# Patient Record
Sex: Female | Born: 1960
Health system: Midwestern US, Community
[De-identification: ages and names within clinical notes are randomized; demographics above are authoritative.]

## PROBLEM LIST (undated history)

## (undated) DIAGNOSIS — G8929 Other chronic pain: Secondary | ICD-10-CM

## (undated) DIAGNOSIS — Z451 Encounter for adjustment and management of infusion pump: Principal | ICD-10-CM

## (undated) DIAGNOSIS — Z9889 Other specified postprocedural states: Principal | ICD-10-CM

## (undated) DIAGNOSIS — G9609 Other spinal cerebrospinal fluid leak: Principal | ICD-10-CM

## (undated) DIAGNOSIS — M545 Low back pain, unspecified: Secondary | ICD-10-CM

## (undated) DIAGNOSIS — K219 Gastro-esophageal reflux disease without esophagitis: Secondary | ICD-10-CM

## (undated) HISTORY — DX: Low back pain: M54.5

## (undated) HISTORY — DX: Gastro-esophageal reflux disease without esophagitis: K21.9

## (undated) HISTORY — DX: Low back pain, unspecified: M54.50

---
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
## 1998-02-03 HISTORY — PX: OTHER SURGICAL HISTORY: SHX169

## 2000-02-04 HISTORY — PX: NASAL SINUS SURGERY: SHX719

## 2005-02-03 HISTORY — PX: ABDOMINAL HYSTERECTOMY: SHX81

## 2007-02-04 HISTORY — PX: CHOLECYSTECTOMY: SHX55

## 2008-02-04 HISTORY — PX: CARPAL TUNNEL RELEASE: SHX101

## 2008-02-29 ENCOUNTER — Emergency Department (HOSPITAL_COMMUNITY): Admission: EM | Admit: 2008-02-29 | Discharge: 2008-02-29 | Payer: Self-pay | Admitting: Emergency Medicine

## 2008-04-28 ENCOUNTER — Emergency Department (HOSPITAL_COMMUNITY): Admission: EM | Admit: 2008-04-28 | Discharge: 2008-04-29 | Payer: Self-pay | Admitting: Emergency Medicine

## 2008-08-14 ENCOUNTER — Emergency Department (HOSPITAL_COMMUNITY): Admission: EM | Admit: 2008-08-14 | Discharge: 2008-08-14 | Payer: Self-pay | Admitting: Emergency Medicine

## 2008-09-23 ENCOUNTER — Emergency Department (HOSPITAL_COMMUNITY): Admission: EM | Admit: 2008-09-23 | Discharge: 2008-09-24 | Payer: Self-pay | Admitting: Emergency Medicine

## 2008-09-24 ENCOUNTER — Ambulatory Visit (HOSPITAL_COMMUNITY): Admission: RE | Admit: 2008-09-24 | Discharge: 2008-09-24 | Payer: Self-pay | Admitting: Emergency Medicine

## 2008-09-24 ENCOUNTER — Ambulatory Visit: Payer: Self-pay | Admitting: Vascular Surgery

## 2008-09-24 ENCOUNTER — Encounter (INDEPENDENT_AMBULATORY_CARE_PROVIDER_SITE_OTHER): Payer: Self-pay | Admitting: Emergency Medicine

## 2008-11-20 ENCOUNTER — Encounter: Admission: RE | Admit: 2008-11-20 | Discharge: 2008-11-20 | Payer: Self-pay | Admitting: Orthopedic Surgery

## 2009-02-03 HISTORY — PX: OTHER SURGICAL HISTORY: SHX169

## 2009-02-03 HISTORY — PX: LUMBAR FUSION: SHX111

## 2009-03-22 ENCOUNTER — Inpatient Hospital Stay (HOSPITAL_COMMUNITY): Admission: RE | Admit: 2009-03-22 | Discharge: 2009-03-25 | Payer: Self-pay | Admitting: *Deleted

## 2009-04-08 ENCOUNTER — Inpatient Hospital Stay (HOSPITAL_COMMUNITY): Admission: RE | Admit: 2009-04-08 | Discharge: 2009-04-13 | Payer: Self-pay | Admitting: *Deleted

## 2009-04-10 ENCOUNTER — Encounter (INDEPENDENT_AMBULATORY_CARE_PROVIDER_SITE_OTHER): Payer: Self-pay | Admitting: Orthopedic Surgery

## 2009-04-10 ENCOUNTER — Ambulatory Visit: Payer: Self-pay | Admitting: Vascular Surgery

## 2009-04-18 ENCOUNTER — Inpatient Hospital Stay (HOSPITAL_COMMUNITY): Admission: AD | Admit: 2009-04-18 | Discharge: 2009-04-27 | Payer: Self-pay | Admitting: *Deleted

## 2009-04-23 ENCOUNTER — Ambulatory Visit: Payer: Self-pay | Admitting: Physical Medicine & Rehabilitation

## 2009-04-23 ENCOUNTER — Encounter (INDEPENDENT_AMBULATORY_CARE_PROVIDER_SITE_OTHER): Payer: Self-pay | Admitting: Orthopedic Surgery

## 2009-08-29 ENCOUNTER — Encounter
Admission: RE | Admit: 2009-08-29 | Discharge: 2009-11-27 | Payer: Self-pay | Admitting: Physical Medicine & Rehabilitation

## 2009-09-20 ENCOUNTER — Ambulatory Visit: Payer: Self-pay | Admitting: Physical Medicine & Rehabilitation

## 2009-11-01 ENCOUNTER — Ambulatory Visit: Payer: Self-pay | Admitting: Physical Medicine & Rehabilitation

## 2009-11-30 ENCOUNTER — Encounter
Admission: RE | Admit: 2009-11-30 | Discharge: 2010-02-28 | Payer: Self-pay | Source: Home / Self Care | Attending: Physical Medicine & Rehabilitation | Admitting: Physical Medicine & Rehabilitation

## 2009-12-03 ENCOUNTER — Ambulatory Visit: Payer: Self-pay | Admitting: Physical Medicine & Rehabilitation

## 2009-12-31 ENCOUNTER — Ambulatory Visit: Payer: Self-pay | Admitting: Physical Medicine & Rehabilitation

## 2010-02-24 ENCOUNTER — Encounter: Payer: Self-pay | Admitting: Internal Medicine

## 2010-03-26 NOTE — Procedures (Signed)
[name], [name]              ACCOUNT NO.:  [id number]  MEDICAL RECORD NO.:  [id number]           PATIENT TYPE:  LOCATION:                                 FACILITY:  PHYSICIAN:  Erick Colace, M.D.DATE OF BIRTH:  Feb 17, 1960  DATE OF PROCEDURE:  11/01/2009 DATE OF DISCHARGE:                              OPERATIVE REPORT  REASON FOR VISIT:  This is a left S1 transforaminal lumbar epidural steroid injection.  This is a late dictation.  INDICATION FOR THE PROCEDURE:  Pain from the back in to the left lower extremity, consistent with S1 radiculitis.  Pain is only partially responsive to medication management, interferes with activity, and result in Oswestry disability index score of 36%.  Informed consent was obtained after describing risks and benefits of the procedure with the patient.  These include bleeding, bruising, and infection.  She elects to proceed and has given written consent.  The patient was placed prone on fluoroscopy table.  Betadine prep, sterile drape.  A 25-gauge 1-1/2-inch needle was used to anesthetize the skin and subcu tissue, 1% lidocaine x1 mL.  Then, 22-gauge 3-1/2-inch spinal needle was inserted under fluoroscopic guidance, starting at the left S1 foramen.  AP and lateral images utilized.  Omnipaque 180 x2 mL demonstrated good epidural spread followed by injection of  1 mL of 10 mg/mL of dexamethasone and 2 mL of 1% MPF lidocaine.  The patient tolerated the procedure well.  Postprocedure instructions given.     Erick Colace, M.D. Electronically Signed    AEK/MEDQ  D:  03/25/2010 10:58:55  T:  03/25/2010 20:30:58  Job:  [id number]

## 2010-04-24 LAB — CBC
HCT: 36.9 % (ref 36.0–46.0)
Platelets: 288 10*3/uL (ref 150–400)
RDW: 12.4 % (ref 11.5–15.5)
WBC: 6.3 10*3/uL (ref 4.0–10.5)

## 2010-04-24 LAB — TYPE AND SCREEN
ABO/RH(D): O POS
Antibody Screen: NEGATIVE

## 2010-04-24 LAB — COMPREHENSIVE METABOLIC PANEL
Calcium: 9.1 mg/dL (ref 8.4–10.5)
Chloride: 100 mEq/L (ref 96–112)
Creatinine, Ser: 0.6 mg/dL (ref 0.4–1.2)
Potassium: 3.9 mEq/L (ref 3.5–5.1)
Sodium: 135 mEq/L (ref 135–145)
Total Protein: 7.5 g/dL (ref 6.0–8.3)

## 2010-04-24 LAB — URINALYSIS, ROUTINE W REFLEX MICROSCOPIC
Bilirubin Urine: NEGATIVE
Glucose, UA: NEGATIVE mg/dL
Ketones, ur: NEGATIVE mg/dL
Nitrite: NEGATIVE
pH: 7 (ref 5.0–8.0)

## 2010-04-24 LAB — ABO/RH: ABO/RH(D): O POS

## 2010-04-24 LAB — APTT: aPTT: 32 s (ref 24–37)

## 2010-04-24 LAB — PROTIME-INR: Prothrombin Time: 14.1 seconds (ref 11.6–15.2)

## 2010-04-29 LAB — CBC
HCT: 22.6 % — ABNORMAL LOW (ref 36.0–46.0)
HCT: 30.8 % — ABNORMAL LOW (ref 36.0–46.0)
HCT: 33.6 % — ABNORMAL LOW (ref 36.0–46.0)
Hemoglobin: 10.6 g/dL — ABNORMAL LOW (ref 12.0–15.0)
Hemoglobin: 11 g/dL — ABNORMAL LOW (ref 12.0–15.0)
MCHC: 32.8 g/dL (ref 30.0–36.0)
MCV: 89.9 fL (ref 78.0–100.0)
MCV: 91.9 fL (ref 78.0–100.0)
RBC: 2.46 MIL/uL — ABNORMAL LOW (ref 3.87–5.11)
RBC: 3.4 MIL/uL — ABNORMAL LOW (ref 3.87–5.11)
RBC: 3.79 MIL/uL — ABNORMAL LOW (ref 3.87–5.11)
RDW: 14.8 % (ref 11.5–15.5)
RDW: 15.7 % — ABNORMAL HIGH (ref 11.5–15.5)
WBC: 10.9 10*3/uL — ABNORMAL HIGH (ref 4.0–10.5)
WBC: 8.8 10*3/uL (ref 4.0–10.5)

## 2010-04-29 LAB — POCT I-STAT, CHEM 8
BUN: 5 mg/dL — ABNORMAL LOW (ref 6–23)
Calcium, Ion: 1.2 mmol/L (ref 1.12–1.32)
Chloride: 104 mEq/L (ref 96–112)
Creatinine, Ser: 0.8 mg/dL (ref 0.4–1.2)
Glucose, Bld: 89 mg/dL (ref 70–99)
TCO2: 29 mmol/L (ref 0–100)

## 2010-04-29 LAB — BASIC METABOLIC PANEL
CO2: 27 mEq/L (ref 19–32)
Calcium: 8.7 mg/dL (ref 8.4–10.5)
Calcium: 8.7 mg/dL (ref 8.4–10.5)
Chloride: 102 mEq/L (ref 96–112)
Chloride: 104 mEq/L (ref 96–112)
Creatinine, Ser: 0.61 mg/dL (ref 0.4–1.2)
Creatinine, Ser: 0.7 mg/dL (ref 0.4–1.2)
GFR calc Af Amer: >60 mL/min (ref >60)
GFR calc Af Amer: >60 mL/min (ref >60)
Glucose, Bld: 84 mg/dL (ref 70–99)
Potassium: 3.9 mEq/L (ref 3.5–5.1)
Sodium: 138 mEq/L (ref 135–145)

## 2010-04-29 LAB — URINALYSIS, MICROSCOPIC ONLY
Glucose, UA: NEGATIVE mg/dL
Ketones, ur: NEGATIVE mg/dL
Protein, ur: NEGATIVE mg/dL
pH: 6.5 (ref 5.0–8.0)

## 2010-04-29 LAB — PREPARE RBC (CROSSMATCH)

## 2010-04-29 LAB — HCG, SERUM, QUALITATIVE: Preg, Serum: NEGATIVE

## 2010-04-29 LAB — TYPE AND SCREEN: Antibody Screen: NEGATIVE

## 2010-05-12 LAB — URINALYSIS, ROUTINE W REFLEX MICROSCOPIC
Bilirubin Urine: NEGATIVE
Ketones, ur: NEGATIVE mg/dL
Nitrite: NEGATIVE
Specific Gravity, Urine: 1.002 — ABNORMAL LOW (ref 1.005–1.030)
Urobilinogen, UA: 0.2 mg/dL (ref 0.0–1.0)
pH: 8 (ref 5.0–8.0)

## 2010-05-14 ENCOUNTER — Emergency Department (HOSPITAL_COMMUNITY)
Admission: EM | Admit: 2010-05-14 | Discharge: 2010-05-14 | Disposition: A | Discharge status: Home / Self Care | Payer: Self-pay | Attending: Emergency Medicine | Admitting: Emergency Medicine

## 2010-05-14 DIAGNOSIS — K117 Disturbances of salivary secretion: Secondary | ICD-10-CM | POA: Insufficient documentation

## 2010-05-14 DIAGNOSIS — R220 Localized swelling, mass and lump, head: Secondary | ICD-10-CM | POA: Insufficient documentation

## 2010-05-28 NOTE — Procedures (Addendum)
[name], COBB              ACCOUNT NO.:  [id number]  MEDICAL RECORD NO.:  [id number]           PATIENT TYPE:  LOCATION:                                 FACILITY:  PHYSICIAN:  Erick Colace, M.D.DATE OF BIRTH:  12-08-1960  DATE OF PROCEDURE: DATE OF DISCHARGE:                              OPERATIVE REPORT  PROCEDURE:  Left S1 transforaminal lumbar epidural steroid injection under fluoroscopic guidance.  INDICATIONS:  Lumbar pain and left S1 radiculitis.  Informed consent was obtained after describing the risks and benefits of the procedure.  These include bleeding, bruising, infection.  She lists an allergy to IV dye, but this is an iodinated dye, not a non-iodinated contrast.  Informed consent was obtained after describing the risks and benefits of the procedure with the patient.  These include bleeding, bruising, and infection.  She elects to proceed and has given written consent.  The patient placed prone on fluoroscopy table.  Betadine prep, sterile drape 25-gauge inch and half needle was used to anesthetize the skin and subcu tissue with 1% lidocaine x2 mL, and a 22-gauge 3-1/2 inch spinal needle was inserted into the left S1 foramen.  AP and lateral images utilized. Omnipaque 180 under live fluoro demonstrated no intravascular uptake, then a solution containing 1 mL of 10 mg/mL dexamethasone and 2 mL of 1% MPF lidocaine were injected.  The patient tolerated the procedure well. Pre injection pain level 05/10, post injection 0/10.     Erick Colace, M.D. Electronically Signed    AEK/MEDQ  D:  05/27/2010 11:08:04  T:  05/27/2010 23:32:21  Job:  [id number]

## 2010-06-20 ENCOUNTER — Ambulatory Visit: Payer: Self-pay | Admitting: Physical Medicine & Rehabilitation

## 2010-06-20 ENCOUNTER — Encounter: Payer: Medicaid Other | Attending: Physical Medicine & Rehabilitation

## 2010-06-20 DIAGNOSIS — M961 Postlaminectomy syndrome, not elsewhere classified: Secondary | ICD-10-CM | POA: Insufficient documentation

## 2010-06-20 DIAGNOSIS — M47817 Spondylosis without myelopathy or radiculopathy, lumbosacral region: Secondary | ICD-10-CM

## 2010-06-20 DIAGNOSIS — M545 Low back pain, unspecified: Secondary | ICD-10-CM | POA: Insufficient documentation

## 2010-06-20 DIAGNOSIS — IMO0002 Reserved for concepts with insufficient information to code with codable children: Secondary | ICD-10-CM | POA: Insufficient documentation

## 2010-06-21 NOTE — Assessment & Plan Note (Signed)
A 50 year old female with prior history of multiple lumbar surgery.  She had both transforaminal approach with her lumbar fusion L5-S1 March 27, 2009, and then the anterior approach April 10, 2009.  Postop only had continued pain, had been on combination of Duragesic and Norco at one point.  She was evaluated at this clinic.  Urine drug screen September 25, 2009, was consistent.  She underwent S1 transforaminal injections left side November 01, 2009, December 03, 2009 and December 31, 2009 as well as February 28, 2010.  She has received 3-week pain relief with this procedure.  Oswestry score is 2%.  Standing tolerance is 1 hour, sitting tolerance is 1 hour.  She is 20 pounds, occasional 5 pounds frequent.  Occasional bending and stooping.  She is independent with her self-care mobility.  She has weakness, numbness, tingling, spasms, constipation on review of systems.  Pain score is numeric.  Pain rating scale is 7.  FAMILY HISTORY:  Heart disease, diabetes, hypertension, alcohol and drug abuse.  PHYSICAL EXAMINATION:  VITAL SIGNS:  Blood pressure 132/82, pulse 95, respirations 18 and O2 sat 100% on room air. GENERAL:  No acute distress.  Mood and affect appropriate. EXTREMITIES:  Her gait shows no evidence of toe drag or knee instability.  She has decreased left gastroc strength 4-.  She has decreased sensation in the left S1 dermatome and decreased left S1 reflex.  Negative straight leg raising test.  Remainder of muscle groups in the bilateral lower extremities are normal.  Lumbar range of motion is 25% extension, 50% flexion and 50% lateral bending.  IMPRESSION: 1. Lumbar post laminectomy syndrome. 2. Chronic left S1 radiculitis.  PLAN: 1. We will restart on hydrocodone 5/325 q.i.d. 2. Recheck urine drug screen today. 3. Mid-Level Clinic followup in 3 months.  I think she can do light-duty type of job, but cannot do anything other than a light-duty type of  job.     [name], M.D. Electronically Signed    AEK/MedQ D:  06/20/2010 13:38:27  T:  06/21/2010 00:03:36  Job #:  [id number]

## 2010-09-12 ENCOUNTER — Encounter: Payer: Medicaid Other | Attending: Physical Medicine & Rehabilitation | Admitting: Neurosurgery

## 2010-09-12 DIAGNOSIS — M961 Postlaminectomy syndrome, not elsewhere classified: Secondary | ICD-10-CM | POA: Insufficient documentation

## 2010-09-12 DIAGNOSIS — G8928 Other chronic postprocedural pain: Secondary | ICD-10-CM

## 2010-09-12 DIAGNOSIS — IMO0002 Reserved for concepts with insufficient information to code with codable children: Secondary | ICD-10-CM | POA: Insufficient documentation

## 2010-09-12 DIAGNOSIS — M545 Low back pain, unspecified: Secondary | ICD-10-CM | POA: Insufficient documentation

## 2010-09-13 NOTE — Assessment & Plan Note (Signed)
Account [id number].  This is a 50 year old female patient of Dr. Wynn Banker seen for post laminectomy syndrome.  She has had multiple lumbar surgeries.  She has had injections as well as an anterior approach fusion done in March 2011.  She has had quite a bit of pain since that time.  She reports her pain today as being in the left side of her low back.  This is pretty isolated to that area.  She states she has had some increased pain lately and has had to use more medicine than normal.  She rates her pain today at 6.  It is a sharp burning, stabbing pain with paresthesias. Activity level is 4-5.  The pain is worse during the day, evening, and night.  Sleep patterns are fair.  Bending, sitting, standing, and most activities aggravate.  Rest, medication, and injections tend to help. She walks without assistance.  She can walk up to an hour at a time. She climbs steps.  She does not drive.  She is not employed.  Currently, she needs help with some household duties.  REVIEW OF SYSTEMS:  Notable for those difficulties described above as well as some constipation, nausea, spasms, and weakness.  No suicidal thoughts.  Her Oswestry score today is 44.  PAST MEDICAL HISTORY:  Unchanged.  SOCIAL HISTORY:  She is married.  She lives with her husband and kids.  FAMILY HISTORY:  Unchanged.  PHYSICAL EXAMINATION:  VITAL SIGNS:  Her blood pressure is 138/79, pulse 89, respirations 16, O2 sats 98 on room air.  Her motor strength and sensation appear to be intact.  She does give way to pain on the affected side, somewhat in iliopsoas.  Constitutionally, she is within normal limits.  She is alert and oriented x3.  She does have a slight limp when arising from a seated position.  ASSESSMENT: 1. Post laminectomy syndrome lumbar post laminectomy syndrome. 2. Chronic left radiculitis.  PLAN: 1. The patient is 5 days/20 pills short on her hydrocodone.  She has     requested to be switch back to oxycodone  and I declined to do so     due to her short pill count.  She states she had to take more than     normal due to increased pain at times. 2. She will follow up in my clinic or with Dr. Wynn Banker in 1 month.     However ultimately issued a prescription today for hydrocodone     5/325 one p.o. q.i.d.  I gave her 50 with no refill.  Normal fill     is 100.  I will discuss the short pill count with Dr. Wynn Banker and     let him to give a disposition from that point on, she will be back     in the office next week.  Her questions were encouraged and     answered.     Trentyn Boisclair L. Blima Dessert Electronically Signed    RLW/MedQ D:  09/12/2010 15:07:01  T:  09/13/2010 00:42:58  Job #:  [id number]

## 2010-09-24 ENCOUNTER — Emergency Department (HOSPITAL_COMMUNITY)
Admission: EM | Admit: 2010-09-24 | Discharge: 2010-09-24 | Disposition: A | Discharge status: Home / Self Care | Payer: Self-pay | Attending: Emergency Medicine | Admitting: Emergency Medicine

## 2010-09-24 DIAGNOSIS — D179 Benign lipomatous neoplasm, unspecified: Secondary | ICD-10-CM | POA: Insufficient documentation

## 2010-10-15 ENCOUNTER — Ambulatory Visit: Payer: Self-pay | Admitting: Physical Medicine & Rehabilitation

## 2010-10-15 ENCOUNTER — Encounter: Payer: Medicaid Other | Attending: Physical Medicine & Rehabilitation

## 2010-10-15 DIAGNOSIS — M961 Postlaminectomy syndrome, not elsewhere classified: Secondary | ICD-10-CM | POA: Insufficient documentation

## 2010-10-15 DIAGNOSIS — M76899 Other specified enthesopathies of unspecified lower limb, excluding foot: Secondary | ICD-10-CM

## 2010-10-15 DIAGNOSIS — IMO0002 Reserved for concepts with insufficient information to code with codable children: Secondary | ICD-10-CM | POA: Insufficient documentation

## 2010-10-15 DIAGNOSIS — M545 Low back pain, unspecified: Secondary | ICD-10-CM | POA: Insufficient documentation

## 2010-10-15 NOTE — Assessment & Plan Note (Signed)
A 50 year old female lumbar post laminectomy syndrome with chronic left radiculitis.  She had anterior and posterior approach fusion done March 2011, pain since that time.  Oswestry score stable at 44.  Average pain is stable at 6.  She needs help with meal prep, household duties and shopping, but is able to do everything else.  She is walking up to a 1 mile with her son.  She thinks she could walk about an hour at a time if needed.  Her pain is described as sharp, burning, tingling and aching.  Her current pain medications include: 1. Gabapentin 300 q.i.d. 2. Hydrocodone 5/325 one p.o. q.i.d.  They are helping her.  Pill     counts have been normal.  There is some concern at last visit with     mid-level provider.  Opioid risk tool score reviewed again 1 low     risk.  PHYSICAL EXAMINATION:  Straight leg raising test is negative.  Lower extremity strength is normal.  Lumbar spine range of motion reduced about 50% of normal from forward flexion, extension, lateral rotation and bending.  IMPRESSION: 1. Lumbar post laminectomy syndrome. 2. Narcotic analgesic use had an episode of poor compliance now     appears to be more compliant.  We will recheck a urine drug screen     next visit in approximately one month's time mid-level visit 1     month.  I will see her back in 6 months.  No interventional     procedures needed at this time.  The patient remains functional on     the current medications.     Erick Colace, M.D. Electronically Signed    AEK/MedQ D:  10/15/2010 16:03:36  T:  10/15/2010 40:98:11  Job #:  [id number]

## 2010-10-16 NOTE — Procedures (Signed)
[name], [name]              ACCOUNT NO.:  [id number]  MEDICAL RECORD NO.:  [id number]           PATIENT TYPE:  O  LOCATION:  [facility]                          FACILITY:  [facility]  PHYSICIAN:  Dashon Mcintire L. Baelyn Doring, ANP-CDATE OF BIRTH:  07-Mar-1960  DATE OF PROCEDURE:  10/15/2010 DATE OF DISCHARGE:                              OPERATIVE REPORT  This is a patient of Dr. Wynn Banker who was just seen as a new patient. She has requested a left greater trochanter bursitis injection for pain in her left hip pain.  After informed consent, the risk and benefits of the procedure were explained to the patient.  Betadine prep just posterior to the left greater trochanter, I injected her with 4 mL lidocaine and 1 mL of Depo-Medrol 40 mg.  She tolerated it well, there was no blood drawback.  She was instructed to ice the area tonight.  She will follow up with Dr. Wynn Banker, he ordered.     Delrico Minehart L. Blima Dessert Electronically Signed    RLW/MEDQ  D:  10/15/2010 13:27:07  T:  10/16/2010 00:05:09  Job:  [id number]

## 2010-11-04 ENCOUNTER — Ambulatory Visit: Payer: Self-pay | Admitting: Physical Therapy

## 2010-11-19 ENCOUNTER — Ambulatory Visit: Payer: Medicaid Other | Attending: Physical Medicine & Rehabilitation | Admitting: Physical Therapy

## 2010-12-24 ENCOUNTER — Encounter: Payer: Medicaid Other | Attending: Physical Medicine & Rehabilitation | Admitting: Neurosurgery

## 2010-12-24 ENCOUNTER — Encounter: Payer: Self-pay | Admitting: Neurosurgery

## 2010-12-24 DIAGNOSIS — M545 Low back pain, unspecified: Secondary | ICD-10-CM | POA: Insufficient documentation

## 2010-12-24 DIAGNOSIS — M76899 Other specified enthesopathies of unspecified lower limb, excluding foot: Secondary | ICD-10-CM | POA: Insufficient documentation

## 2010-12-24 DIAGNOSIS — G8929 Other chronic pain: Secondary | ICD-10-CM | POA: Insufficient documentation

## 2010-12-24 DIAGNOSIS — M961 Postlaminectomy syndrome, not elsewhere classified: Secondary | ICD-10-CM | POA: Insufficient documentation

## 2010-12-24 DIAGNOSIS — IMO0002 Reserved for concepts with insufficient information to code with codable children: Secondary | ICD-10-CM | POA: Insufficient documentation

## 2010-12-24 NOTE — Procedures (Signed)
[name], [name]              ACCOUNT NO.:  [id number]  MEDICAL RECORD NO.:  [id number]           PATIENT TYPE:  O  LOCATION:  [facility]                          FACILITY:  [facility]  PHYSICIAN:  Juliya Magill L. Adis Sturgill, ANP-CDATE OF BIRTH:  21-Mar-1960  DATE OF PROCEDURE:  12/24/2010 DATE OF DISCHARGE:                              OPERATIVE REPORT  This is a post laminectomy patient with chronic pain and sees Dr. Wynn Banker.  She did call to come in and get a left greater trochanter bursitis injection.  She rates her average pain as a 6-8.  This is a sharp, stabbing, aching-type pain.  General activity level is 7.  Pain is worse during the day.  Sleep patterns are fair.  Walking, bending, sitting, and standing tend to aggravate.  Rest and medication injections help.  She walks without assistance.  She can climb steps.  She does not drive.  She can walk about 10 minutes at a time.  She is on disability. She needs help with household duties and meal prep.  REVIEW OF SYSTEMS:  Notable for difficulties as described above as well as some constipation, nausea, spasms, paresthesias, and weakness.  No suicidal thoughts or aberrant behaviors.  Last pill count and UDS consistent, UDS completed today.  Past medical history, social history, and family history are unchanged.  PHYSICAL EXAMINATION:  VITAL SIGNS:  Blood pressure is 122/76, pulse 103, respirations 18, O2 sats 97 on room air. CONSTITUTIONAL:  She is within normal limits. NEUROLOGIC: Motor strength and sensation are intact.  She is alert and oriented x3.  She has a slight limp.  She is point tender over the posterior aspect of the greater trochanter on the left.  ASSESSMENT: 1. Post-laminectomy syndrome, chronic pain. 2. Acute on chronic bilateral greater trochanter bursitis, left     greater than right.  PLAN:  She will call when she needs her Norco refilled.  After informed consent, the risks and benefits were explained to the patient  with proper time out.  I Betadine prep just posterior to the left greater trochanter and injected her with 3 mL of lidocaine and 1 mL 40 mg of Depo-Medrol.  She tolerated it well.  There was no blood draw back.  No bleeding after the injection.  She knows to ice the area tonight.  Her questions were encouraged and answered.  We will see her back in 1 month.     [name] Electronically Signed   RLW/MEDQ  D:  12/24/2010 09:40:34  T:  12/24/2010 10:10:08  Job:  [id number]

## 2011-01-14 ENCOUNTER — Ambulatory Visit: Payer: Self-pay | Admitting: Neurosurgery

## 2011-01-21 ENCOUNTER — Ambulatory Visit: Payer: Self-pay | Admitting: Neurosurgery

## 2011-01-21 ENCOUNTER — Encounter: Payer: Self-pay | Admitting: Neurosurgery

## 2011-01-21 ENCOUNTER — Encounter: Payer: Self-pay | Attending: Physical Medicine & Rehabilitation

## 2011-01-21 ENCOUNTER — Ambulatory Visit: Payer: Self-pay | Admitting: Physical Medicine & Rehabilitation

## 2011-01-21 DIAGNOSIS — M545 Low back pain, unspecified: Secondary | ICD-10-CM | POA: Insufficient documentation

## 2011-01-21 DIAGNOSIS — IMO0002 Reserved for concepts with insufficient information to code with codable children: Secondary | ICD-10-CM | POA: Insufficient documentation

## 2011-01-21 DIAGNOSIS — M961 Postlaminectomy syndrome, not elsewhere classified: Secondary | ICD-10-CM | POA: Insufficient documentation

## 2011-01-22 NOTE — Assessment & Plan Note (Signed)
A 50 year old female, who has lumbar post laminectomy syndrome.  She has chronic postoperative pain, chronic left lower extremity radiculitis. She had a posterior fusion done on April 22, 2009 at L5-S1.  She had anterior approach.  She has been treated in the past with Duragesic. She is treated with gabapentin 300 mg q.i.d.  She is currently on hydrocodone 5/325 q.i.d.  I think she has a longer pain relief with oxycodone, and things she can get by with 3 times a day on that.  She has had good relief with her medicine.  She is going back to school for nursing.  She had a left hip bursa injection done by our Mid-Level, Kallie Edward last month and has done well with that.  Would like to go through some physical therapy.  She is considering spinal cord stimulation.  She has had no new medical problems in the interval time.  Her average pain is 6/10.  She is walking 30 minutes.  At time, she needs help with meal prep, household duties, and shopping, but otherwise independent. Weakness, numbness, tingling, spasms, constipation, positive on review of systems.  ALLERGIES:  SHRIMP, RADIOACTIVE DYE, MILK, MOLD, GRASSES.  SOCIAL HISTORY:  Married, lives with her husband and kids.  FAMILY HISTORY:  Heart disease, diabetes, hypertension, alcohol abuse.  PHYSICAL EXAMINATION:  VITAL SIGNS:  Blood pressure 144/92, pulse 80, respirations 16, O2 sat 99% on room air, height 5 feet 1 inches, weight 145 pounds. GENERAL:  A well-developed well-nourished female in no acute distress. Orientation x3.  Mood and affect are appropriate. MUSCULOSKELETAL:  Her back has good forward flexion, but extension is limited by pain.  She has good straight leg raising bilaterally.  She has normal lower extremity strength.  She has some tenderness per patient over the left greater trochanter, but hip, knee, and ankle range of motion are all normal.  IMPRESSION:  Lumbar post laminectomy syndrome, chronic left  lower extremity radicular pain.  PLAN: 1. We will switch her from hydrocodone 5 mg q.i.d. to oxycodone 5 mg     t.i.d. 2. I gave her a [employer] spinal cord stimulator DVD.  Should she get     her Medicaid and want to undergo spinal cord stim trial, would     refer out for this to Dr. Herminio Heads.  Report to Dr. Claria Dice, if he does that which I am not sure. 3. Discussed with patient, agrees with plan.     Erick Colace, M.D. Electronically Signed    AEK/MedQ D:  01/21/2011 13:37:13  T:  01/21/2011 19:57:47  Job #:  [id number]

## 2011-02-20 ENCOUNTER — Encounter: Payer: Self-pay | Attending: Neurosurgery | Admitting: Neurosurgery

## 2011-02-20 DIAGNOSIS — M961 Postlaminectomy syndrome, not elsewhere classified: Secondary | ICD-10-CM | POA: Insufficient documentation

## 2011-02-20 DIAGNOSIS — G894 Chronic pain syndrome: Secondary | ICD-10-CM

## 2011-02-20 DIAGNOSIS — M545 Low back pain: Secondary | ICD-10-CM

## 2011-02-21 NOTE — Assessment & Plan Note (Signed)
HISTORY:  This is a patient of Dr. Wynn Banker.  Seen for post laminectomy syndrome.  She reports no change in her pain at 5, it is sharp, burning, stabbing pain.  General activity level is 5.  Pain is worse during the day, evening, and night,.  All activities aggravate.  Rest, medication, injections help.  She walks without assistance.  She can walk an hour at a time.  She climb steps.  She does not drive.  She is on disability and needs help with household duties.  REVIEW OF SYSTEMS:  Notable for difficulties described above as well as some weakness, paresthesias, spasms, nausea, and constipation.  No suicidal thoughts or aberrant behaviors/pill count and UDS consistent.  PAST MEDICAL HISTORY:  Unchanged.  SOCIAL HISTORY:  Unchanged.  FAMILY HISTORY:  Unchanged.  PHYSICAL EXAMINATION:  VITAL SIGNS:  Her blood pressure is 138/79, pulse 82, respirations 18, O2 sats 96 on room air. EXTREMITIES:  Motor strength and sensation intact. CONSTITUTIONAL:  She is within normal limits.  She is alert and oriented x3.  She has a normal gait.  IMPRESSION:  Lumbar post laminectomy syndrome.  PLAN: 1. Refill gabapentin 300 mg 1 p.o. q.i.d. 120 with 5 refills. 2. Oxycodone 5 mg 1 p.o. t.i.d. 90 with no refill.  Questions were encouraged and answered.  Follow up in a month.     Ima Hafner L. Blima Dessert     ______________________________ Erick Colace, M.D.   RLW/MedQ D:  02/20/2011 10:11:12  T:  02/21/2011 00:05:19  Job #:  [id number]

## 2011-03-20 ENCOUNTER — Encounter: Payer: Self-pay | Attending: Neurosurgery

## 2011-03-20 DIAGNOSIS — IMO0002 Reserved for concepts with insufficient information to code with codable children: Secondary | ICD-10-CM

## 2011-03-20 DIAGNOSIS — M76899 Other specified enthesopathies of unspecified lower limb, excluding foot: Secondary | ICD-10-CM

## 2011-03-20 DIAGNOSIS — M961 Postlaminectomy syndrome, not elsewhere classified: Secondary | ICD-10-CM

## 2011-04-15 ENCOUNTER — Other Ambulatory Visit: Payer: Self-pay | Admitting: *Deleted

## 2011-04-15 MED ORDER — METHOCARBAMOL 500 MG PO TABS: 500.0000 mg | ORAL_TABLET | Freq: Three times a day (TID) | ORAL | Status: DC | PRN

## 2011-04-22 ENCOUNTER — Encounter: Payer: Self-pay | Admitting: *Deleted

## 2011-04-22 ENCOUNTER — Encounter: Payer: Self-pay | Attending: Physical Medicine & Rehabilitation | Admitting: *Deleted

## 2011-04-22 VITALS — BP 141/77 | HR 114 | Resp 18 | Ht 61.0 in | Wt 151.0 lb

## 2011-04-22 DIAGNOSIS — IMO0002 Reserved for concepts with insufficient information to code with codable children: Secondary | ICD-10-CM

## 2011-04-22 DIAGNOSIS — M961 Postlaminectomy syndrome, not elsewhere classified: Secondary | ICD-10-CM | POA: Insufficient documentation

## 2011-04-22 MED ORDER — OXYCODONE HCL 5 MG PO TABS: 5.0000 mg | ORAL_TABLET | Freq: Three times a day (TID) | ORAL | Status: DC

## 2011-04-22 NOTE — Progress Notes (Signed)
Reports increased "nerve pain" in left leg; I will ask Dr Wynn Banker about scheduling another LESI vs SI joint injection. Last performed in 2011. No other questions voiced for MD.

## 2011-04-27 IMAGING — CT CT L SPINE W/O CM
4 of 7 series · 14 of 33 positions shown, 16 images · non-contrast
Comparison: MRI 11/20/2008.  Radiography 03/22/2009.

CLINICAL DATA: Mechanical failure of internal fixation device.
Preoperative evaluation.

CT LUMBAR SPINE WITHOUT CONTRAST
TECHNIQUE: Multidetector CT imaging of the lumbar spine was
performed without intravenous contrast administration. Multiplanar
CT image reconstructions were also generated.

[Series 2: l-spine · axial · 0.36mm/px · z∈[-320,-195]mm · 3 of 100 slices shown, 4 images]
[im 25/100  soft-tissue]
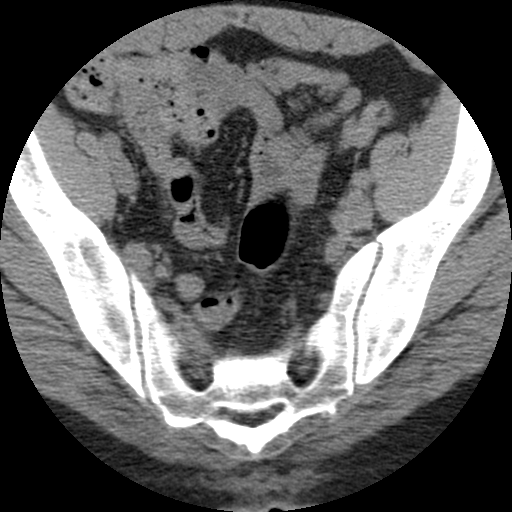
[im 25/100  bone]
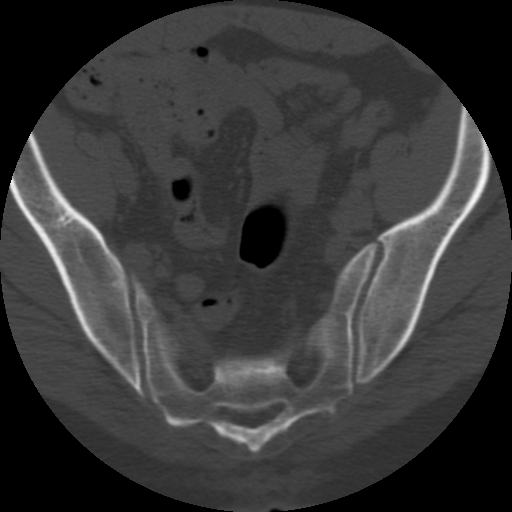
[im 50/100  bone]
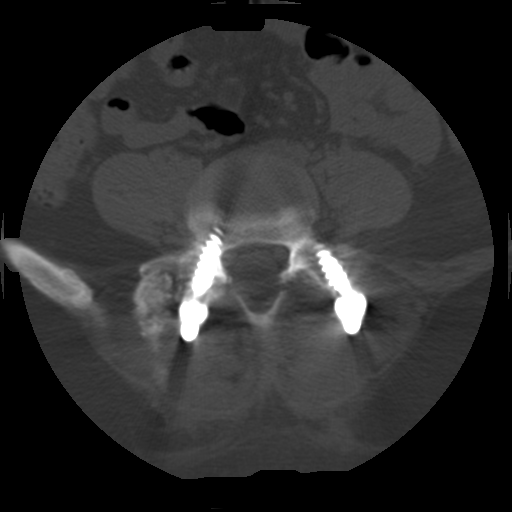
[im 75/100  bone]
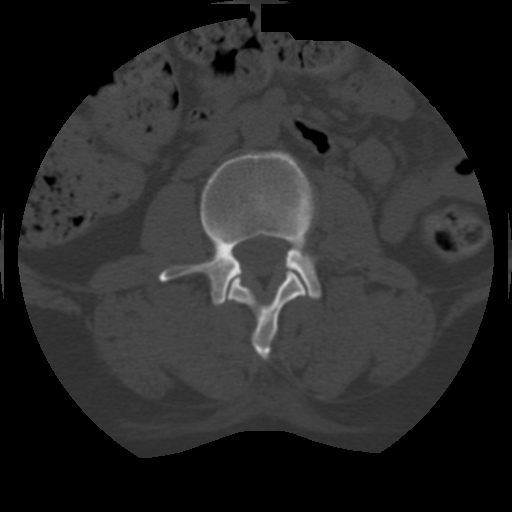

[Series 3: recon 2: l-spine · axial · 0.36mm/px · z∈[-320,-195]mm · 3 of 100 slices shown]
[im 25/100  bone]
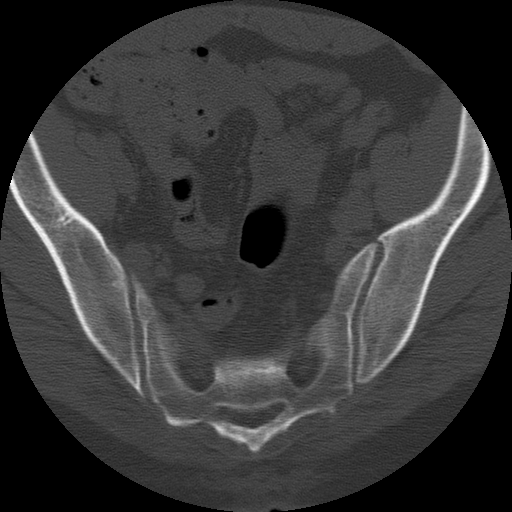
[im 50/100  bone]
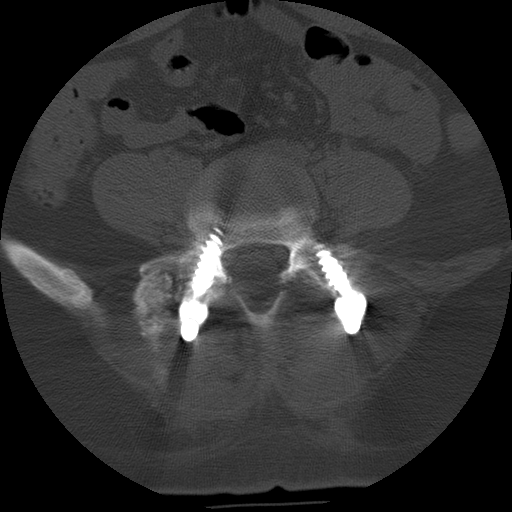
[im 75/100  bone]
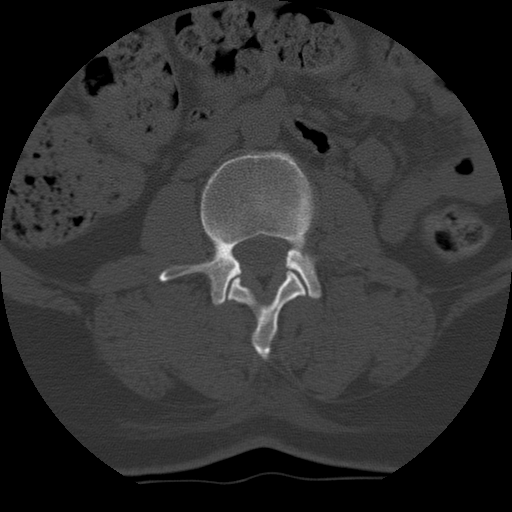

[Series 401: sag1 · sagittal · 0.50mm/px · 3 of 49 slices shown]
[im 10/49  bone]
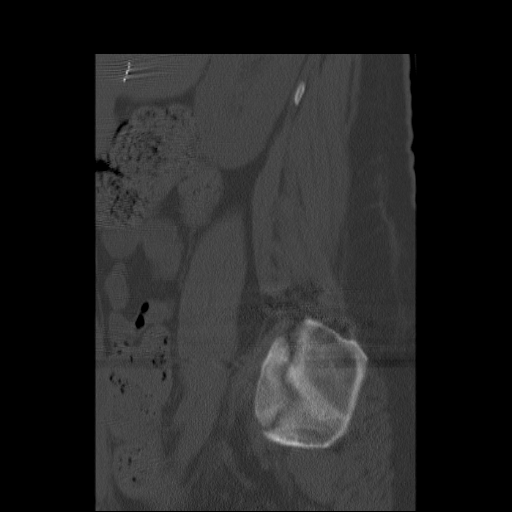
[im 20/49  bone]
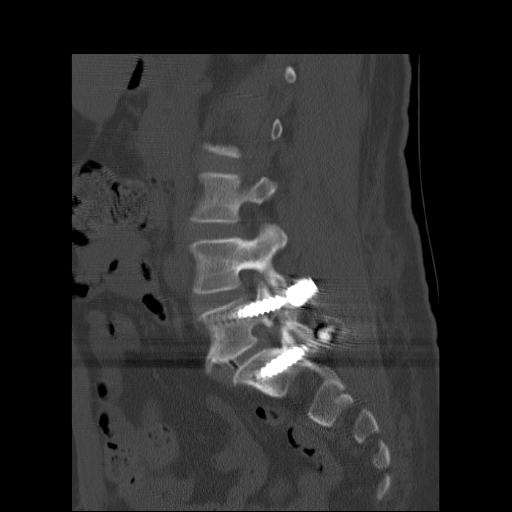
[im 29/49  bone]
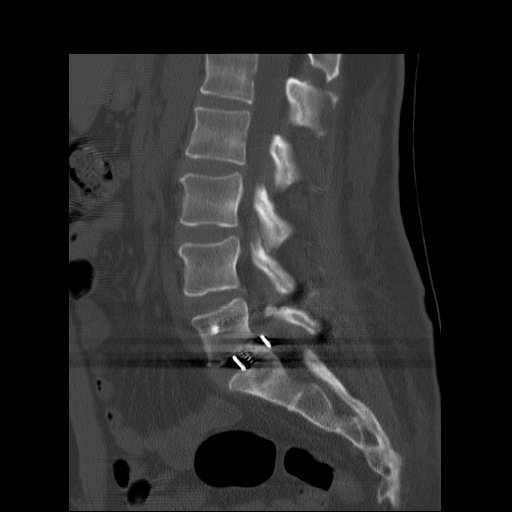

[Series 402: cor · coronal · 0.50mm/px · 5 of 42 slices shown, 6 images]
[im 14/42  bone]
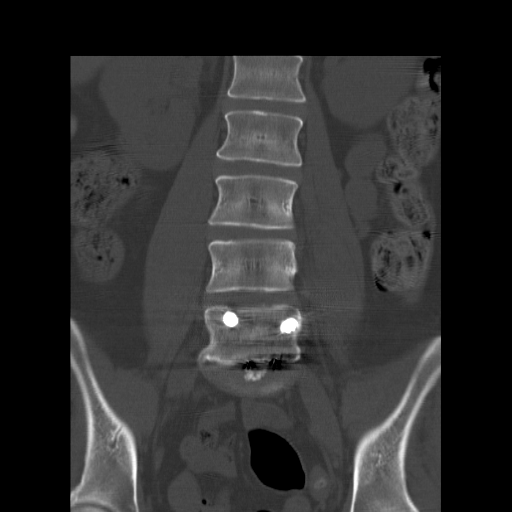
[im 18/42  bone]
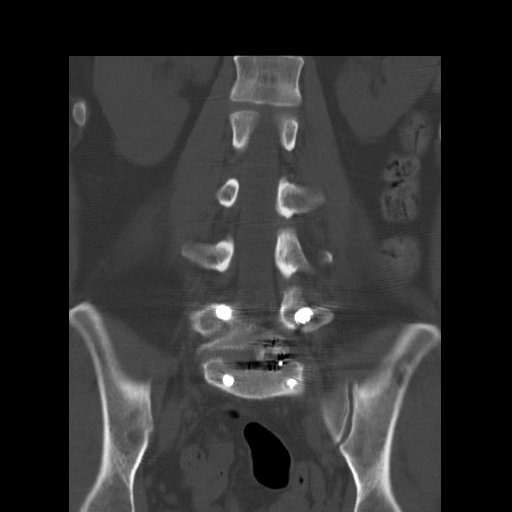
[im 21/42  soft-tissue]
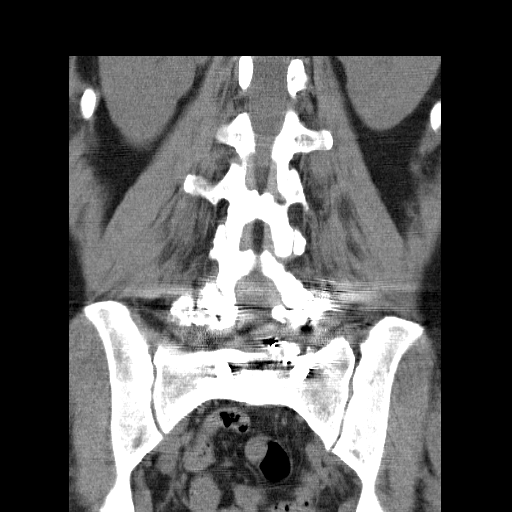
[im 21/42  bone]
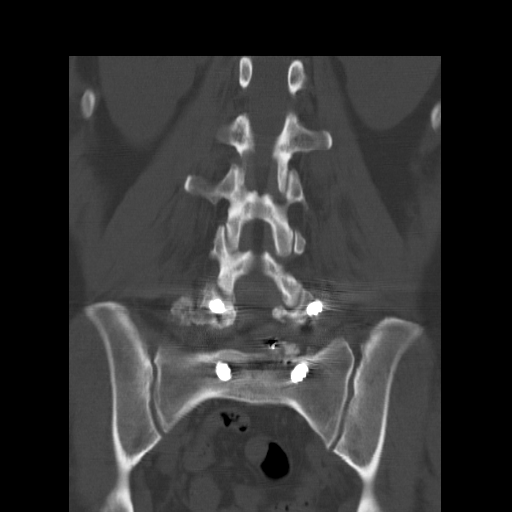
[im 24/42  bone]
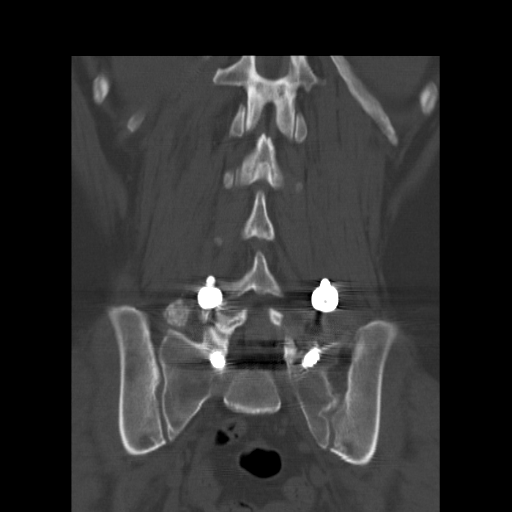
[im 28/42  bone]
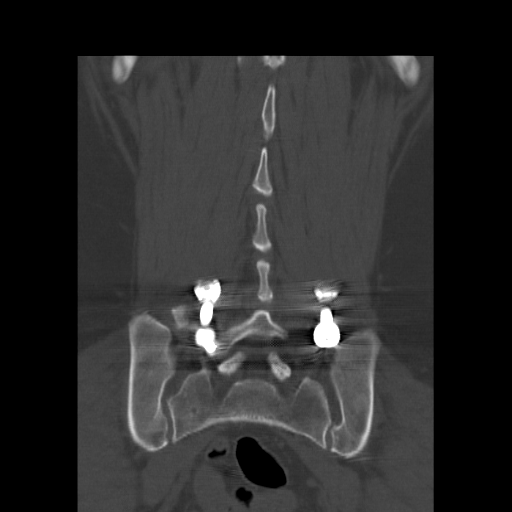

[14 of 33 positions shown; findings below may reference images not displayed]

FINDINGS: L1-2:  Normal.

L2-3:  Normal.

L3-4:  Minimal bulging of the disc.  No apparent neural
compression.

L4-5:  Minimal bulging of the disc.  No apparent neural
compression.

L5-S1:  There has been partial left hemilaminectomy and
facetectomy.  Pedicle screws have been placed bilaterally at L4 and
L5.  The right pedicle screw at L4 comes close to the medial cortex
of the pedicle but I do not believe breaches it.  Interbody fusion
material is in place.  Part of the interbody fusion material
extends into the left lateral recess and would have potential to
affect the left S1 nerve root.  No evidence of screw fracture or
rod displacement.  Some posterior fusion material is in place on
the right.  Chronic pars defect on the right incidentally noted.
IMPRESSION: L5-S1 pedicle screws well positioned.  Interbody fusion material is
in place.  There is some extension of material into the left
lateral recess, that could possibly affect the left S1 nerve root.

## 2011-04-30 ENCOUNTER — Telehealth: Payer: Self-pay | Admitting: Physical Medicine & Rehabilitation

## 2011-04-30 NOTE — Telephone Encounter (Signed)
Patient asking if we had authorization for injection.  Do not see order for injection.

## 2011-05-05 ENCOUNTER — Telehealth: Payer: Self-pay | Admitting: *Deleted

## 2011-05-05 NOTE — Telephone Encounter (Signed)
RN visit on 04/22/11. Requested app't for SI vs ESI. None since last year. May we schedule injection?

## 2011-05-05 NOTE — Telephone Encounter (Signed)
May schedule injection SI versus ESI depending on patient's symptoms chow will decide at the time

## 2011-05-29 ENCOUNTER — Encounter: Payer: Self-pay | Attending: Neurosurgery

## 2011-05-29 ENCOUNTER — Encounter: Payer: Self-pay | Admitting: Physical Medicine & Rehabilitation

## 2011-05-29 ENCOUNTER — Ambulatory Visit (HOSPITAL_BASED_OUTPATIENT_CLINIC_OR_DEPARTMENT_OTHER): Payer: Self-pay | Admitting: Physical Medicine & Rehabilitation

## 2011-05-29 VITALS — BP 154/82 | HR 91 | Ht 61.0 in | Wt 149.0 lb

## 2011-05-29 DIAGNOSIS — M961 Postlaminectomy syndrome, not elsewhere classified: Secondary | ICD-10-CM | POA: Insufficient documentation

## 2011-05-29 DIAGNOSIS — IMO0002 Reserved for concepts with insufficient information to code with codable children: Secondary | ICD-10-CM

## 2011-05-29 DIAGNOSIS — M5416 Radiculopathy, lumbar region: Secondary | ICD-10-CM | POA: Insufficient documentation

## 2011-05-29 MED ORDER — OXYCODONE HCL 5 MG PO TABS: 5.0000 mg | ORAL_TABLET | Freq: Three times a day (TID) | ORAL | Status: DC | PRN

## 2011-05-29 NOTE — Progress Notes (Signed)
PROCEDURE: Left S1 transforaminal lumbar epidural steroid injection  under fluoroscopic guidance.  INDICATIONS: Lumbar pain and left S1 radiculitis.  Informed consent was obtained after describing the risks and benefits of  the procedure. These include bleeding, bruising, infection. She lists  an allergy to IV dye, but this is an iodinated dye, not a non-iodinated  contrast.  Informed consent was obtained after describing the risks and benefits of  the procedure with the patient. These include bleeding, bruising, and  infection. She elects to proceed and has given written consent. The  patient placed prone on fluoroscopy table. Betadine prep, sterile drape  25-gauge inch and half needle was used to anesthetize the skin and subcu  tissue with 1% lidocaine x2 mL, and a 22-gauge 3-1/2 inch spinal needle  was inserted into the left S1 foramen. AP and lateral images utilized.  Omnipaque 180 under live fluoro demonstrated no intravascular uptake,  then a solution containing 1 mL of 10 mg/mL dexamethasone and 2 mL of 1%  MPF lidocaine were injected. The patient tolerated the procedure well

## 2011-05-29 NOTE — Patient Instructions (Signed)
Back Exercises Back exercises help treat and prevent back injuries. The goal is to increase your strength in your belly (abdominal) and back muscles. These exercises can also help with flexibility. Start these exercises when told by your doctor. HOME CARE Back exercises include: Pelvic Tilt.  Lie on your back with your knees bent. Tilt your pelvis until the lower part of your back is against the floor. Hold this position 5 to 10 sec. Repeat this exercise 5 to 10 times.  Knee to Chest.  Pull 1 knee up against your chest and hold for 20 to 30 seconds. Repeat this with the other knee. This may be done with the other leg straight or bent, whichever feels better. Then, pull both knees up against your chest.  Sit-Ups or Curl-Ups.  Bend your knees 90 degrees. Start with tilting your pelvis, and do a partial, slow sit-up. Only lift your upper half 30 to 45 degrees off the floor. Take at least 2 to 3 seonds for each sit-up. Do not do sit-ups with your knees out straight. If partial sit-ups are difficult, simply do the above but with only tightening your belly (abdominal) muscles and holding it as told.  Hip-Lift.  Lie on your back with your knees flexed 90 degrees. Push down with your feet and shoulders as you raise your hips 2 inches off the floor. Hold for 10 seconds, repeat 5 to 10 times.  Back Arches.  Lie on your stomach. Prop yourself up on bent elbows. Slowly press on your hands, causing an arch in your low back. Repeat 3 to 5 times.  Shoulder-Lifts.  Lie face down with arms beside your body. Keep hips and belly pressed to floor as you slowly lift your head and shoulders off the floor.  Do not overdo your exercises. Be careful in the beginning. Exercises may cause you some mild back discomfort. If the pain lasts for more than 15 minutes, stop the exercises until you see your doctor. Improvement with exercise for back problems is slow.  Document Released: 02/22/2010 Document Revised: 01/09/2011  Document Reviewed: 02/22/2010 [employer] Patient Information 2012 [employer]. 

## 2011-05-29 NOTE — Progress Notes (Signed)
  PROCEDURE RECORD [facility]   Name: MARLETA LAPIERRE DOB:Jun 12, 1960 MRN: [id number]  Date:05/29/2011  Physician: Claudette Laws, MD    Nurse/CMA: Glyn Gerads/Shumaker  Allergies:  Allergies  Allergen Reactions  . Shellfish Allergy     Dx w/ allergy testing; allergy to shrimp  . Sulfa Drugs Cross Reactors Itching  . Codeine Itching  . Ivp Dye (Iodinated Diagnostic Agents) Itching    Consent Signed: yes  Is patient diabetic? no  CBG today  Pregnant: no LMP: No LMP recorded. Patient has had a hysterectomy. (age 12-55)  Anticoagulants: no Anti-inflammatory: yes (ibuprofen) Antibiotics: no  Procedure: Transforaminal lumbar epidural steroid injection  Position: Prone Start Time: 9:30 am  End Time: 9:41 am  Fluoro Time: 16  RN/CMA Ashiya Kinkead Asma Boldon    Time 9:16 am 9:44 am    BP 154/82 158/80    Pulse 91 63    Respirations 16 16    O2 Sat 100 100    S/S 6 6    Pain Level 6 4     D/C home with Onalee Hua (husband), patient A & O X 3, D/C instructions reviewed, and sits independently.

## 2011-06-05 ENCOUNTER — Ambulatory Visit: Payer: Self-pay | Admitting: Physical Medicine & Rehabilitation

## 2011-06-26 ENCOUNTER — Encounter: Payer: Self-pay | Admitting: Physical Medicine and Rehabilitation

## 2011-06-26 ENCOUNTER — Encounter: Payer: Self-pay | Attending: Physical Medicine & Rehabilitation | Admitting: Physical Medicine and Rehabilitation

## 2011-06-26 VITALS — BP 134/82 | HR 102 | Resp 16 | Ht 61.0 in | Wt 150.0 lb

## 2011-06-26 DIAGNOSIS — M961 Postlaminectomy syndrome, not elsewhere classified: Secondary | ICD-10-CM | POA: Insufficient documentation

## 2011-06-26 DIAGNOSIS — G8929 Other chronic pain: Secondary | ICD-10-CM | POA: Insufficient documentation

## 2011-06-26 DIAGNOSIS — M25559 Pain in unspecified hip: Secondary | ICD-10-CM

## 2011-06-26 DIAGNOSIS — M545 Low back pain, unspecified: Secondary | ICD-10-CM | POA: Insufficient documentation

## 2011-06-26 DIAGNOSIS — IMO0002 Reserved for concepts with insufficient information to code with codable children: Secondary | ICD-10-CM | POA: Insufficient documentation

## 2011-06-26 DIAGNOSIS — K219 Gastro-esophageal reflux disease without esophagitis: Secondary | ICD-10-CM | POA: Insufficient documentation

## 2011-06-26 DIAGNOSIS — M76899 Other specified enthesopathies of unspecified lower limb, excluding foot: Secondary | ICD-10-CM | POA: Insufficient documentation

## 2011-06-26 MED ORDER — OXYCODONE HCL 5 MG PO TABS: 5.0000 mg | ORAL_TABLET | Freq: Three times a day (TID) | ORAL | Status: DC | PRN

## 2011-06-26 NOTE — Progress Notes (Deleted)
Subjective:    Patient ID: [name], female    DOB: 1960/03/21, 51 y.o.   MRN: [id number]  HPI The patient is a 51 year old female, who presents with LBP, which radiates into her posterior LE in a S1 distribution . 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 The symptoms have not improved after the surgeries, they have gotten worse. The patient complains about moderate to severe pain . Patient also complains about numbness and tingling in the same distribution  . Applying heat, taking medications , doing aquatic exercises  alleviate the symptoms. Prolonged standing , sitting   aggrevates the symptoms. The patient grades his pain as a 7 /10. The patient states that she is very frustrated and is planning to see another spine surgeon for possible further treatment. The patient also complains about left lateral hip pain. Pain Inventory Average Pain 7 Pain Right Now 7 My pain is sharp, burning, dull, stabbing, tingling and aching  In the last 24 hours, has pain interfered with the following? General activity 5 Relation with others 7 Enjoyment of life 2 What TIME of day is your pain at its worst? All Day Sleep (in general) Poor  Pain is worse with: bending, sitting and standing Pain improves with: rest, heat/ice, medication and injections Relief from Meds: 5  Mobility walk without assistance how many minutes can you walk? 45 ability to climb steps?  yes do you drive?  no  Function not employed: date last employed 1005 disabled: date disabled 2010 I need assistance with the following:  meal prep, household duties and shopping  Neuro/Psych numbness tremor tingling spasms  Prior Studies Any changes since last visit?  no  Physicians involved in your care Any changes since last visit?  no   Family History  Problem Relation Age of Onset  . Cancer Mother    History   Social History  . Marital Status: Married   Spouse Name: N/A    Number of Children: N/A  . Years of Education: N/A   Social History Main Topics  . Smoking status: Never Smoker   . Smokeless tobacco: None  . Alcohol Use: No  . Drug Use: No  . Sexually Active: None   Other Topics Concern  . None   Social History Narrative  . None   Past Surgical History  Procedure Date  . Tonsilectomy, adenoidectomy, bilateral myringotomy and tubes 1980  . Nasal sinus surgery 2002  . Abdominal hysterectomy 2007  . Cholecystectomy 2009  . Carpal tunnel release 2010    LEFT HANDED SURGERY  . Lumbar fusion 2011  . Lumbar repair 2011  . Lumbar repair 2000   Past Medical History  Diagnosis Date  . GERD (gastroesophageal reflux disease)   . Lumbago    BP 134/82  Pulse 102  Resp 16  Ht 5\' 1"  (1.549 m)  Wt 150 lb (68.04 kg)  BMI 28.34 kg/m2  SpO2 100%  Review of Systems  Constitutional: Negative.   HENT: Negative.   Eyes: Negative.   Respiratory: Negative.   Cardiovascular: Negative.   Gastrointestinal: Positive for nausea and constipation.  Genitourinary: Negative.   Musculoskeletal: Negative.   Skin: Negative.   Neurological: Negative.   Hematological: Negative.   Psychiatric/Behavioral: Negative.        Objective:   Physical Exam  Constitutional: She is oriented to person, place, and time. She appears well-developed.  HENT:  Head: Normocephalic.  Eyes: Pupils are equal, round, and reactive to light.  Neck: Normal range of motion.  Neurological: She is alert and oriented to person, place, and time.  Skin: Skin is warm and dry.  Psychiatric: She has a normal mood and affect.  Symmetric normal motor tone is noted throughout. Normal muscle bulk. Muscle testing reveals 5/5 muscle strength of the upper extremity, and 5/5 of the lower extremity, except left hip flexion 4/5, mainly because of pain, and left gastrocnemius 4/5. Full range of motion in upper and lower extremities. ROM of spine is restricted. DTR in the upper  and lower extremity are present and symmetric 2+, except left achilles tendon reflex was demineshed 1+. No clonus is noted.  Patient arises from chair without difficulty. Narrow based gait with normal arm swing bilateral. Pain on palpating left trochanteric bursa.          Assessment & Plan:  Lumbar post laminectomy syndrome.  Chronic left lower extremity radicular pain.  Left trochanteric bursitis. Left trochanteric bursa injection given today, patient had relief in the past from injection. Continue medication , also continue walking program. Advised patient to pick up her aquatic aerobic as soon as she finds a pool close to her new home.

## 2011-06-26 NOTE — Patient Instructions (Signed)
Continue with your medication, continue walking program. Advised to pick up water aerobics if possible, which has helped in the past.

## 2011-07-12 ENCOUNTER — Other Ambulatory Visit: Payer: Self-pay | Admitting: Physical Medicine & Rehabilitation

## 2011-07-22 ENCOUNTER — Telehealth: Payer: Self-pay | Admitting: Physical Medicine & Rehabilitation

## 2011-07-22 NOTE — Telephone Encounter (Signed)
NA. Left message to return my call.

## 2011-07-22 NOTE — Telephone Encounter (Signed)
Patient inquiring about RF.  Has questions.  Please call.

## 2011-07-23 NOTE — Telephone Encounter (Signed)
3rd and final attempt to reach patient without success. Left final voicemail that if she has further questions, she will need to call.

## 2011-07-24 NOTE — Progress Notes (Signed)
Subjective:    Patient ID: [name], female    DOB: 10-24-1960, 51 y.o.   MRN: [id number]  Hip Pain   Leg Pain    The patient is a 51 year old female, who presents with LBP, which radiates into her posterior LE in a S1 distribution . 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 The symptoms have not improved after the surgeries, they have gotten worse. The patient complains about moderate to severe pain . Patient also complains about numbness and tingling in the same distribution  . Applying heat, taking medications , doing aquatic exercises  alleviate the symptoms. Prolonged standing , sitting   aggrevates the symptoms. The patient grades his pain as a 7 /10. The patient states that she is very frustrated and is planning to see another spine surgeon for possible further treatment. The patient also complains about left lateral hip pain. Pain Inventory Average Pain 7 Pain Right Now 7 My pain is sharp, burning, dull, stabbing, tingling and aching  In the last 24 hours, has pain interfered with the following? General activity 5 Relation with others 7 Enjoyment of life 2 What TIME of day is your pain at its worst? All Day Sleep (in general) Poor  Pain is worse with: bending, sitting and standing Pain improves with: rest, heat/ice, medication and injections Relief from Meds: 5  Mobility walk without assistance how many minutes can you walk? 45 ability to climb steps?  yes do you drive?  no  Function not employed: date last employed 1005 disabled: date disabled 2010 I need assistance with the following:  meal prep, household duties and shopping  Neuro/Psych numbness tremor tingling spasms  Prior Studies Any changes since last visit?  no  Physicians involved in your care Any changes since last visit?  no   Family History  Problem Relation Age of Onset  . Cancer Mother    History   Social History  .  Marital Status: Married    Spouse Name: N/A    Number of Children: N/A  . Years of Education: N/A   Social History Main Topics  . Smoking status: Never Smoker   . Smokeless tobacco: None  . Alcohol Use: No  . Drug Use: No  . Sexually Active: None   Other Topics Concern  . None   Social History Narrative  . None   Past Surgical History  Procedure Date  . Tonsilectomy, adenoidectomy, bilateral myringotomy and tubes 1980  . Nasal sinus surgery 2002  . Abdominal hysterectomy 2007  . Cholecystectomy 2009  . Carpal tunnel release 2010    LEFT HANDED SURGERY  . Lumbar fusion 2011  . Lumbar repair 2011  . Lumbar repair 2000   Past Medical History  Diagnosis Date  . GERD (gastroesophageal reflux disease)   . Lumbago    BP 134/82  Pulse 102  Resp 16  Ht 5\' 1"  (1.549 m)  Wt 150 lb (68.04 kg)  BMI 28.34 kg/m2  SpO2 100%  Review of Systems  Constitutional: Negative.   HENT: Negative.   Eyes: Negative.   Respiratory: Negative.   Cardiovascular: Negative.   Gastrointestinal: Positive for nausea and constipation.  Genitourinary: Negative.   Musculoskeletal: Negative.   Skin: Negative.   Neurological: Negative.   Hematological: Negative.   Psychiatric/Behavioral: Negative.        Objective:   Physical Exam  Constitutional: She is oriented to person, place,  and time. She appears well-developed.  HENT:  Head: Normocephalic.  Eyes: Pupils are equal, round, and reactive to light.  Neck: Normal range of motion.  Neurological: She is alert and oriented to person, place, and time.  Skin: Skin is warm and dry.  Psychiatric: She has a normal mood and affect.  Symmetric normal motor tone is noted throughout. Normal muscle bulk. Muscle testing reveals 5/5 muscle strength of the upper extremity, and 5/5 of the lower extremity, except left hip flexion 4/5, mainly because of pain, and left gastrocnemius 4/5. Full range of motion in upper and lower extremities. ROM of spine is  restricted. DTR in the upper and lower extremity are present and symmetric 2+, except left achilles tendon reflex was demineshed 1+. No clonus is noted.  Patient arises from chair without difficulty. Narrow based gait with normal arm swing bilateral. Pain on palpating left trochanteric bursa.          Assessment & Plan:  Lumbar post laminectomy syndrome.  Chronic left lower extremity radicular pain.  Left trochanteric bursitis. Left trochanteric bursa injection given today, patient had relief in the past from injection. Continue medication , also continue walking program. Advised patient to pick up her aquatic aerobic as soon as she finds a pool close to her new home.

## 2011-07-25 ENCOUNTER — Encounter: Payer: Self-pay | Admitting: Physical Medicine and Rehabilitation

## 2011-07-25 ENCOUNTER — Encounter: Payer: Self-pay | Attending: Physical Medicine & Rehabilitation | Admitting: Physical Medicine and Rehabilitation

## 2011-07-25 ENCOUNTER — Telehealth: Payer: Self-pay | Admitting: Physical Medicine & Rehabilitation

## 2011-07-25 VITALS — BP 161/93 | HR 105 | Resp 16 | Wt 149.0 lb

## 2011-07-25 DIAGNOSIS — M961 Postlaminectomy syndrome, not elsewhere classified: Secondary | ICD-10-CM

## 2011-07-25 DIAGNOSIS — M545 Low back pain, unspecified: Secondary | ICD-10-CM

## 2011-07-25 DIAGNOSIS — M76899 Other specified enthesopathies of unspecified lower limb, excluding foot: Secondary | ICD-10-CM | POA: Insufficient documentation

## 2011-07-25 DIAGNOSIS — M79609 Pain in unspecified limb: Secondary | ICD-10-CM | POA: Insufficient documentation

## 2011-07-25 MED ORDER — OXYCODONE HCL 5 MG PO TABS: 5.0000 mg | ORAL_TABLET | Freq: Three times a day (TID) | ORAL | Status: DC | PRN

## 2011-07-25 NOTE — Telephone Encounter (Signed)
PATIENT NEEDS A SEDATIVE BEFORE HER MRI SCHEDULED 07/30/11.

## 2011-07-25 NOTE — Patient Instructions (Signed)
Continue with medication, continue trying to find a place where you can do water aerobics in [place].

## 2011-07-25 NOTE — Progress Notes (Signed)
Subjective:    Patient ID: [name], female    DOB: 14-Aug-1960, 51 y.o.   MRN: [id number]  HPI The patient is a 51 year old female, who presents with LBP, which radiates into her posterior LE in a S1 distribution . 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 The symptoms have not improved after the surgeries, they have gotten worse. The patient complains about moderate to severe pain . Patient also complains about numbness and tingling in the same distribution . Applying heat, taking medications , doing aquatic exercises alleviate the symptoms. Prolonged standing , sitting aggrevates the symptoms. The patient grades her pain as a 7 /10.Problem has gotten worse. The patient states that she is very frustrated and is planning to see another spine surgeon for possible further treatment, when she has a different insurance. Patient has applied for social disability.   Pain Inventory Average Pain 6 Pain Right Now 5 My pain is constant, sharp, dull, stabbing, tingling and aching  In the last 24 hours, has pain interfered with the following? General activity 6 Relation with others 5 Enjoyment of life 7 What TIME of day is your pain at its worst? constant Sleep (in general) Fair  Pain is worse with: walking, bending, sitting and standing Pain improves with: rest, heat/ice, medication and injections Relief from Meds: 5  Mobility walk without assistance how many minutes can you walk? 30-45  ability to climb steps?  yes do you drive?  yes  Function not employed: date last employed 2005 I need assistance with the following:  meal prep, household duties and shopping  Neuro/Psych weakness numbness tingling spasms depression  Prior Studies Any changes since last visit?  no  Physicians involved in your care Any changes since last visit?  no   Family History  Problem Relation Age of Onset  . Cancer Mother    History    Social History  . Marital Status: Married    Spouse Name: N/A    Number of Children: N/A  . Years of Education: N/A   Social History Main Topics  . Smoking status: Never Smoker   . Smokeless tobacco: Never Used  . Alcohol Use: No  . Drug Use: No  . Sexually Active: None   Other Topics Concern  . None   Social History Narrative  . None   Past Surgical History  Procedure Date  . Tonsilectomy, adenoidectomy, bilateral myringotomy and tubes 1980  . Nasal sinus surgery 2002  . Abdominal hysterectomy 2007  . Cholecystectomy 2009  . Carpal tunnel release 2010    LEFT HANDED SURGERY  . Lumbar fusion 2011  . Lumbar repair 2011  . Lumbar repair 2000   Past Medical History  Diagnosis Date  . GERD (gastroesophageal reflux disease)   . Lumbago    BP 161/93  Pulse 105  Resp 16  Wt 149 lb (67.586 kg)  SpO2 98%    Review of Systems  Gastrointestinal: Positive for constipation.  Musculoskeletal: Positive for back pain.       Spasms  Neurological: Positive for weakness and numbness.       Tingling  Psychiatric/Behavioral: Positive for dysphoric mood.  All other systems reviewed and are negative.       Objective:   Physical Exam Constitutional: She is oriented to person, place, and time. She appears well-developed.  HENT:  Head: Normocephalic.  Eyes: Pupils are equal, round, and reactive to  light.  Neck: Normal range of motion.  Neurological: She is alert and oriented to person, place, and time.  Skin: Skin is warm and dry.  Psychiatric: She has a normal mood and affect.  Symmetric normal motor tone is noted throughout. Normal muscle bulk. Muscle testing reveals 5/5 muscle strength of the upper extremity, and 5/5 of the lower extremity, except left hip flexion 4/5, mainly because of pain, and left gastrocnemius 4/5. Full range of motion in upper and lower extremities. ROM of spine is restricted.  DTR in the upper and lower extremity are present and symmetric 2+,  except left achilles tendon reflex was demineshed 1+. No clonus is noted.  Patient arises from chair without difficulty. Narrow based gait with normal arm swing bilateral.  Pain on palpating left trochanteric bursa.         Assessment & Plan:  Lumbar post laminectomy syndrome.  Chronic left lower extremity radicular pain.  Her pain has gotten worse, ordered MRI of her lumbar spine. Consider further treatment after we have the results. Left trochanteric bursitis.  Left trochanteric bursa injection given today, patient had relief in the past from injection.  Continue medication , also continue walking program. Advised patient to pick up her aquatic aerobic as soon as she finds a pool close to her new home. Follow up in one month, see Dr. Penni Homans after the MRI result.

## 2011-07-30 ENCOUNTER — Other Ambulatory Visit: Payer: Self-pay | Admitting: *Deleted

## 2011-07-30 ENCOUNTER — Ambulatory Visit (HOSPITAL_COMMUNITY)
Admission: RE | Admit: 2011-07-30 | Discharge: 2011-07-30 | Disposition: A | Discharge status: Home / Self Care | Payer: Self-pay | Source: Ambulatory Visit | Attending: Physical Medicine and Rehabilitation | Admitting: Physical Medicine and Rehabilitation

## 2011-07-30 ENCOUNTER — Telehealth: Payer: Self-pay | Admitting: Physical Medicine & Rehabilitation

## 2011-07-30 DIAGNOSIS — Z981 Arthrodesis status: Secondary | ICD-10-CM | POA: Insufficient documentation

## 2011-07-30 DIAGNOSIS — M79609 Pain in unspecified limb: Secondary | ICD-10-CM | POA: Insufficient documentation

## 2011-07-30 DIAGNOSIS — M961 Postlaminectomy syndrome, not elsewhere classified: Secondary | ICD-10-CM

## 2011-07-30 DIAGNOSIS — M545 Low back pain, unspecified: Secondary | ICD-10-CM | POA: Insufficient documentation

## 2011-07-30 MED ORDER — DIAZEPAM 10 MG PO TABS: 10.0000 mg | ORAL_TABLET | Freq: Once | ORAL | Status: AC

## 2011-07-30 NOTE — Telephone Encounter (Signed)
Having MRI 07/30/11 5pm, needs Valium, please call in.

## 2011-07-30 NOTE — Telephone Encounter (Signed)
This was called in already, pt is aware of that.

## 2011-08-26 ENCOUNTER — Other Ambulatory Visit: Payer: Self-pay | Admitting: Physical Medicine & Rehabilitation

## 2011-08-28 ENCOUNTER — Encounter: Payer: Self-pay | Attending: Neurosurgery

## 2011-08-28 ENCOUNTER — Ambulatory Visit (HOSPITAL_BASED_OUTPATIENT_CLINIC_OR_DEPARTMENT_OTHER): Payer: Self-pay | Admitting: Physical Medicine & Rehabilitation

## 2011-08-28 ENCOUNTER — Encounter: Payer: Self-pay | Admitting: Physical Medicine & Rehabilitation

## 2011-08-28 VITALS — BP 170/80 | HR 114 | Resp 16 | Ht 61.0 in | Wt 146.0 lb

## 2011-08-28 DIAGNOSIS — IMO0002 Reserved for concepts with insufficient information to code with codable children: Secondary | ICD-10-CM

## 2011-08-28 DIAGNOSIS — M5416 Radiculopathy, lumbar region: Secondary | ICD-10-CM

## 2011-08-28 DIAGNOSIS — M961 Postlaminectomy syndrome, not elsewhere classified: Secondary | ICD-10-CM | POA: Insufficient documentation

## 2011-08-28 MED ORDER — GABAPENTIN 300 MG PO CAPS: 600.0000 mg | ORAL_CAPSULE | Freq: Three times a day (TID) | ORAL | Status: DC

## 2011-08-28 MED ORDER — OXYCODONE HCL 5 MG PO TABS: 5.0000 mg | ORAL_TABLET | Freq: Three times a day (TID) | ORAL | Status: DC | PRN

## 2011-08-28 NOTE — Progress Notes (Signed)
Lumbosacral transforaminal epidural steroid injection under fluoroscopic guidance  Indication: Lumbosacral radiculitis is not relieved by medication management or other conservative care and interfering with self-care and mobility.   Informed consent was obtained after describing risk and benefits of the procedure with the patient, this includes bleeding, bruising, infection, paralysis and medication side effects.  The patient wishes to proceed and has given written consent.  Patient was placed in prone position.  The lumbar area was marked and prepped with Betadine.  It was entered with a 25-gauge 1-1/2 inch needle and one mL of 1% lidocaine was injected into the skin and subcutaneous tissue.  Then a 22-gauge 3.5 spinal needle was inserted into the Left S1 intervertebral foramen under AP, lateral, and oblique view.  Then a solution containing one mL of 10 mg per mL dexamethasone and 2 mL of 1% lidocaine was injected.  The patient tolerated procedure well.  Post procedure instructions were given.  Please see post procedure form. 

## 2011-08-28 NOTE — Patient Instructions (Addendum)
We increased her gabapentin dose to 600 mg 3 times per day This change will be at your pharmacy You'll have L lumbar Medial Branch Blocks next visit

## 2011-08-28 NOTE — Progress Notes (Signed)
  PROCEDURE RECORD [facility]   Name: DYLANN LAYNE DOB:1960/04/26 MRN: [id number]  Date:08/28/2011  Physician: Claudette Laws, MD    Nurse/CMA: Redgie Grayer  Allergies:  Allergies  Allergen Reactions  . Shellfish Allergy     Dx w/ allergy testing; allergy to shrimp  . Sulfa Drugs Cross Reactors Itching  . Codeine Itching  . Ivp Dye (Iodinated Diagnostic Agents) Itching    Consent Signed: yes  Is patient diabetic? no    Pregnant: no LMP: No LMP recorded. Patient has had a hysterectomy. (age 69-55)  Anticoagulants: no Anti-inflammatory: no Antibiotics: no  Procedure: Sacroiliac Epidural Steroid Injection   Position: Prone Start Time: 12:48pm  End Time: 12:52pm  Fluoro Time: 11 seconds  RN/CMA Ricky Stabs, CMA    Time 12:28pm 12:55pm    BP 170/80 149/97    Pulse 114 117    Respirations 16 16    O2 Sat 99% 98%    S/S 6 6    Pain Level 5-6/10 5/10     D/C home with Onalee Hua (husband), patient A & O X 3, D/C instructions reviewed, and sits independently.

## 2011-09-29 ENCOUNTER — Encounter: Payer: Self-pay | Attending: Physical Medicine and Rehabilitation | Admitting: Physical Medicine and Rehabilitation

## 2011-09-29 ENCOUNTER — Encounter: Payer: Self-pay | Admitting: Physical Medicine and Rehabilitation

## 2011-09-29 VITALS — BP 153/79 | HR 116 | Resp 14 | Ht 61.0 in | Wt 143.0 lb

## 2011-09-29 DIAGNOSIS — M79609 Pain in unspecified limb: Secondary | ICD-10-CM | POA: Insufficient documentation

## 2011-09-29 DIAGNOSIS — G8929 Other chronic pain: Secondary | ICD-10-CM | POA: Insufficient documentation

## 2011-09-29 DIAGNOSIS — R209 Unspecified disturbances of skin sensation: Secondary | ICD-10-CM | POA: Insufficient documentation

## 2011-09-29 DIAGNOSIS — IMO0002 Reserved for concepts with insufficient information to code with codable children: Secondary | ICD-10-CM

## 2011-09-29 DIAGNOSIS — M5416 Radiculopathy, lumbar region: Secondary | ICD-10-CM

## 2011-09-29 DIAGNOSIS — M961 Postlaminectomy syndrome, not elsewhere classified: Secondary | ICD-10-CM | POA: Insufficient documentation

## 2011-09-29 MED ORDER — OXYCODONE HCL 5 MG PO TABS: 5.0000 mg | ORAL_TABLET | Freq: Three times a day (TID) | ORAL | Status: DC | PRN

## 2011-09-29 MED ORDER — DICLOFENAC SODIUM 1 % TD GEL: 2.0000 g | Freq: Four times a day (QID) | TRANSDERMAL | Status: DC

## 2011-09-29 NOTE — Patient Instructions (Signed)
Try to find a place where you can do aquatic exercises. Continue with stretching exercises.

## 2011-09-29 NOTE — Progress Notes (Signed)
Subjective:    Patient ID: [name], female    DOB: [birth year], 51 y.o.   MRN: [id number]  HPI The patient is a 51 year old female, who presents with LBP, which radiates into her posterior LE in a S1 distribution . 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 The symptoms have not improved after the surgeries, they have gotten worse. The patient complains about moderate to severe pain . Patient also complains about numbness and tingling in the same distribution . Applying heat, taking medications , doing aquatic exercises alleviate the symptoms. Prolonged standing , sitting aggrevates the symptoms. The problem has gotten a little better after a TF-ESI at S1 on the left, 40%, but she is still very frustrated and is planning to see another spine surgeon for possible further treatment, when she has a different insurance. Patient has applied for social disability.   Pain Inventory Average Pain 7 Pain Right Now 5 My pain is sharp, burning, tingling and aching  In the last 24 hours, has pain interfered with the following? General activity 4 Relation with others 4 Enjoyment of life 5 What TIME of day is your pain at its worst? varies Sleep (in general) Fair  Pain is worse with: bending, sitting and some activites Pain improves with: rest, heat/ice, medication and injections Relief from Meds: 5  Mobility walk without assistance how many minutes can you walk? 60 ability to climb steps?  yes do you drive?  no Do you have any goals in this area?  yes  Function not employed: date last employed  Do you have any goals in this area?  yes  Neuro/Psych numbness tingling spasms  Prior Studies Any changes since last visit?  no  Physicians involved in your care Any changes since last visit?  no   Family History  Problem Relation Age of Onset  . Cancer Mother    History   Social History  . Marital Status: Married   Spouse Name: N/A    Number of Children: N/A  . Years of Education: N/A   Social History Main Topics  . Smoking status: Never Smoker   . Smokeless tobacco: Never Used  . Alcohol Use: No  . Drug Use: No  . Sexually Active: None   Other Topics Concern  . None   Social History Narrative  . None   Past Surgical History  Procedure Date  . Tonsilectomy, adenoidectomy, bilateral myringotomy and tubes 1980  . Nasal sinus surgery 2002  . Abdominal hysterectomy 2007  . Cholecystectomy 2009  . Carpal tunnel release 2010    LEFT HANDED SURGERY  . Lumbar fusion 2011  . Lumbar repair 2011  . Lumbar repair 2000   Past Medical History  Diagnosis Date  . GERD (gastroesophageal reflux disease)   . Lumbago    BP 153/79  Pulse 116  Resp 14  Ht 5\' 1"  (1.549 m)  Wt 143 lb (64.864 kg)  BMI 27.02 kg/m2  SpO2 95%     Review of Systems  Gastrointestinal: Positive for constipation.  Musculoskeletal: Positive for myalgias, back pain, arthralgias and gait problem.  Neurological: Positive for numbness.  All other systems reviewed and are negative.       Objective:   Physical Exam Constitutional: She is oriented to person, place, and time. She appears well-developed.  HENT:  Head: Normocephalic.  Eyes: Pupils are equal, round, and reactive to light.  Neck: Normal  range of motion.  Neurological: She is alert and oriented to person, place, and time.  Skin: Skin is warm and dry.  Psychiatric: She has a normal mood and affect.  Symmetric normal motor tone is noted throughout. Normal muscle bulk. Muscle testing reveals 5/5 muscle strength of the upper extremity, and 5/5 of the lower extremity, except left hip flexion 4/5, mainly because of pain, and left gastrocnemius 4/5. Full range of motion in upper and lower extremities. ROM of spine is restricted.  DTR in the upper and lower extremity are present and symmetric 2+, except left achilles tendon reflex was demineshed 1+. No clonus is  noted.  Patient arises from chair without difficulty. Narrow based gait with normal arm swing bilateral.  Pain on palpating left trochanteric bursa.         Assessment & Plan:  Lumbar post laminectomy syndrome.  Chronic left lower extremity radicular pain.   MRI of her lumbar spine, showed that bone graft material is pinching on the left S1 nerve. TF-ESI gave her 40 percent relief. Referred her to neurosurgery for possible further treatment.  Continue medication , also continue walking program. Advised patient to pick up her aquatic aerobic as soon as she finds a pool close to her new home.  Prescribed Voltaren gel for flaring of her left trochanteric bursitis. Refilled her Oxycodone 5mg , # 90 Follow up in one month.

## 2011-10-20 ENCOUNTER — Other Ambulatory Visit: Payer: Self-pay | Admitting: Physical Medicine & Rehabilitation

## 2011-10-21 ENCOUNTER — Other Ambulatory Visit: Payer: Self-pay | Admitting: Physical Medicine & Rehabilitation

## 2011-10-29 ENCOUNTER — Encounter: Payer: Self-pay | Admitting: Physical Medicine and Rehabilitation

## 2011-10-29 ENCOUNTER — Encounter: Payer: Self-pay | Attending: Physical Medicine and Rehabilitation | Admitting: Physical Medicine and Rehabilitation

## 2011-10-29 VITALS — BP 138/90 | HR 80 | Resp 16 | Ht 61.0 in | Wt 143.0 lb

## 2011-10-29 DIAGNOSIS — M961 Postlaminectomy syndrome, not elsewhere classified: Secondary | ICD-10-CM | POA: Insufficient documentation

## 2011-10-29 DIAGNOSIS — R209 Unspecified disturbances of skin sensation: Secondary | ICD-10-CM | POA: Insufficient documentation

## 2011-10-29 DIAGNOSIS — K219 Gastro-esophageal reflux disease without esophagitis: Secondary | ICD-10-CM | POA: Insufficient documentation

## 2011-10-29 DIAGNOSIS — M79609 Pain in unspecified limb: Secondary | ICD-10-CM | POA: Insufficient documentation

## 2011-10-29 DIAGNOSIS — M545 Low back pain, unspecified: Secondary | ICD-10-CM | POA: Insufficient documentation

## 2011-10-29 DIAGNOSIS — IMO0002 Reserved for concepts with insufficient information to code with codable children: Secondary | ICD-10-CM | POA: Insufficient documentation

## 2011-10-29 DIAGNOSIS — G8929 Other chronic pain: Secondary | ICD-10-CM | POA: Insufficient documentation

## 2011-10-29 MED ORDER — OXYCODONE HCL 5 MG PO TABS: 5.0000 mg | ORAL_TABLET | Freq: Three times a day (TID) | ORAL | Status: DC | PRN

## 2011-10-29 NOTE — Patient Instructions (Signed)
Stay as active as possible, continue with walking.

## 2011-10-29 NOTE — Progress Notes (Signed)
Subjective:    Patient ID: [name], female    DOB: 1960-05-07, 50 y.o.   MRN: [id number]  HPI The patient is a 51 year old female, who presents with LBP, which radiates into her posterior LE in a S1 distribution . 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 The symptoms have not improved after the surgeries, they have gotten worse. The patient complains about moderate to severe pain . Patient also complains about numbness and tingling in the same distribution . Applying heat, taking medications , doing aquatic exercises alleviate the symptoms. Prolonged standing , sitting aggrevates the symptoms. The problem has gotten a little better after a TF-ESI at S1 on the left, 40%. She is planning to see another spine surgeon for possible further treatment, soon.  Patient has applied for social disability, and will have a hearing in December of 2013.   Pain Inventory Average Pain 7 Pain Right Now 6 My pain is sharp, burning, dull, stabbing, tingling and aching  In the last 24 hours, has pain interfered with the following? General activity 6 Relation with others 6 Enjoyment of life 7 What TIME of day is your pain at its worst? All Day Sleep (in general) Fair  Pain is worse with: bending, sitting, standing and some activites Pain improves with: rest, heat/ice, medication and injections Relief from Meds: 5  Mobility ability to climb steps?  yes do you drive?  no  Function not employed: date last employed  I need assistance with the following:  meal prep, household duties and shopping  Neuro/Psych numbness tingling spasms depression  Prior Studies Any changes since last visit?  no  Physicians involved in your care Any changes since last visit?  no   Family History  Problem Relation Age of Onset  . Cancer Mother    History   Social History  . Marital Status: Married    Spouse Name: N/A    Number of Children: N/A    . Years of Education: N/A   Social History Main Topics  . Smoking status: Never Smoker   . Smokeless tobacco: Never Used  . Alcohol Use: No  . Drug Use: No  . Sexually Active: None   Other Topics Concern  . None   Social History Narrative  . None   Past Surgical History  Procedure Date  . Tonsilectomy, adenoidectomy, bilateral myringotomy and tubes 1980  . Nasal sinus surgery 2002  . Abdominal hysterectomy 2007  . Cholecystectomy 2009  . Carpal tunnel release 2010    LEFT HANDED SURGERY  . Lumbar fusion 2011  . Lumbar repair 2011  . Lumbar repair 2000   Past Medical History  Diagnosis Date  . GERD (gastroesophageal reflux disease)   . Lumbago    BP 138/90  Pulse 80  Resp 16  Ht 5\' 1"  (1.549 m)  Wt 143 lb (64.864 kg)  BMI 27.02 kg/m2  SpO2 100%      Review of Systems  Constitutional: Negative.   HENT: Negative.   Eyes: Negative.   Respiratory: Negative.   Cardiovascular: Negative.   Gastrointestinal: Negative.   Genitourinary: Negative.   Musculoskeletal: Positive for back pain.  Skin: Negative.   Neurological: Positive for numbness.  Hematological: Negative.   Psychiatric/Behavioral: Negative.        Objective:   Physical Exam Constitutional: She is oriented to person, place, and time. She appears well-developed.  HENT:  Head: Normocephalic.  Eyes: Pupils are equal, round, and reactive to light.  Neck: Normal range of motion.  Neurological: She is alert and oriented to person, place, and time.  Skin: Skin is warm and dry.  Psychiatric: She has a normal mood and affect.  Symmetric normal motor tone is noted throughout. Normal muscle bulk. Muscle testing reveals 5/5 muscle strength of the upper extremity, and 5/5 of the lower extremity, except left hip flexion 4/5, mainly because of pain, and left gastrocnemius 4/5. Full range of motion in upper and lower extremities. ROM of spine is restricted.  DTR in the upper and lower extremity are present  and symmetric 2+, except left achilles tendon reflex was demineshed 1+. No clonus is noted.  Patient arises from chair without difficulty. Narrow based gait with normal arm swing bilateral.  Pain on palpating left trochanteric bursa.        Assessment & Plan:  Lumbar post laminectomy syndrome.  Chronic left lower extremity radicular pain.  MRI of her lumbar spine, showed that bone graft material is pinching on the left S1 nerve. TF-ESI gave her 40 percent relief. Referred her to neurosurgery for possible further treatment at last visit, they are working on to get her an appointment.  Continue medication , also continue walking program. Advised patient to pick up her aquatic aerobic as soon as she finds a pool close to her new home. Prescribed Voltaren gel for flaring up of her left trochanteric bursitis. Refilled her Oxycodone 5mg , # 90  Follow up in one month.

## 2011-11-25 ENCOUNTER — Encounter: Payer: Self-pay | Admitting: Physical Medicine and Rehabilitation

## 2011-11-26 ENCOUNTER — Encounter: Payer: Self-pay | Admitting: Physical Medicine and Rehabilitation

## 2011-11-26 ENCOUNTER — Encounter: Payer: Self-pay | Attending: Physical Medicine and Rehabilitation | Admitting: Physical Medicine and Rehabilitation

## 2011-11-26 VITALS — BP 134/74 | HR 105 | Resp 14 | Ht 61.0 in | Wt 146.0 lb

## 2011-11-26 DIAGNOSIS — Z5181 Encounter for therapeutic drug level monitoring: Secondary | ICD-10-CM

## 2011-11-26 DIAGNOSIS — IMO0002 Reserved for concepts with insufficient information to code with codable children: Secondary | ICD-10-CM

## 2011-11-26 DIAGNOSIS — M79609 Pain in unspecified limb: Secondary | ICD-10-CM | POA: Insufficient documentation

## 2011-11-26 DIAGNOSIS — G8929 Other chronic pain: Secondary | ICD-10-CM | POA: Insufficient documentation

## 2011-11-26 DIAGNOSIS — M5416 Radiculopathy, lumbar region: Secondary | ICD-10-CM

## 2011-11-26 DIAGNOSIS — M961 Postlaminectomy syndrome, not elsewhere classified: Secondary | ICD-10-CM

## 2011-11-26 MED ORDER — METHOCARBAMOL 500 MG PO TABS: 500.0000 mg | ORAL_TABLET | Freq: Three times a day (TID) | ORAL | Status: DC

## 2011-11-26 MED ORDER — OXYCODONE HCL 5 MG PO TABS: 5.0000 mg | ORAL_TABLET | Freq: Three times a day (TID) | ORAL | Status: DC | PRN

## 2011-11-26 NOTE — Patient Instructions (Signed)
Continue staying as active as pain allows , try to keep your back warm.

## 2011-11-26 NOTE — Progress Notes (Signed)
Subjective:    Patient ID: [name], female    DOB: 1960/11/24, 51 y.o.   MRN: [id number]  HPI The patient is a 51 year old female, who presents with LBP, which radiates into her posterior LE in a S1 distribution . 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 The symptoms have not improved after the surgeries, they have gotten worse. The patient complains about moderate to severe pain . Patient also complains about numbness and tingling in the same distribution . Applying heat, taking medications , doing aquatic exercises alleviate the symptoms. Prolonged standing , sitting aggrevates the symptoms. The problem has gotten a little better after a TF-ESI at S1 on the left, 40%. She is planning to see another spine surgeon for possible further treatment, soon. Patient has applied for social disability, and will have a hearing in December of 2013.   Pain Inventory Average Pain 7 Pain Right Now 6 My pain is constant, sharp, burning, dull, stabbing, tingling and aching  In the last 24 hours, has pain interfered with the following? General activity 7 Relation with others 7 Enjoyment of life 9 What TIME of day is your pain at its worst? constant Sleep (in general) Fair  Pain is worse with: bending and sitting Pain improves with: rest and medication Relief from Meds: 6  Mobility walk without assistance how many minutes can you walk? 30-45 ability to climb steps?  yes do you drive?  no  Function not employed: date last employed  I need assistance with the following:  meal prep, household duties and shopping  Neuro/Psych numbness tingling spasms depression  Prior Studies Any changes since last visit?  no  Physicians involved in your care Any changes since last visit?  no   Family History  Problem Relation Age of Onset  . Cancer Mother    History   Social History  . Marital Status: Married    Spouse Name: N/A   Number of Children: N/A  . Years of Education: N/A   Social History Main Topics  . Smoking status: Never Smoker   . Smokeless tobacco: Never Used  . Alcohol Use: No  . Drug Use: No  . Sexually Active: None   Other Topics Concern  . None   Social History Narrative  . None   Past Surgical History  Procedure Date  . Tonsilectomy, adenoidectomy, bilateral myringotomy and tubes 1980  . Nasal sinus surgery 2002  . Abdominal hysterectomy 2007  . Cholecystectomy 2009  . Carpal tunnel release 2010    LEFT HANDED SURGERY  . Lumbar fusion 2011  . Lumbar repair 2011  . Lumbar repair 2000   Past Medical History  Diagnosis Date  . GERD (gastroesophageal reflux disease)   . Lumbago    BP 134/74  Pulse 105  Resp 14  Ht 5\' 1"  (1.549 m)  Wt 146 lb (66.225 kg)  BMI 27.59 kg/m2  SpO2 97%    Review of Systems  Gastrointestinal: Positive for constipation.  Musculoskeletal: Positive for back pain.       Spasms  Neurological: Positive for numbness.       Tingling  Psychiatric/Behavioral: Positive for dysphoric mood.  All other systems reviewed and are negative.       Objective:   Physical Exam  Constitutional: She is oriented to person, place, and time. She appears well-developed.  HENT:  Head: Normocephalic.  Eyes: Pupils are equal, round, and  reactive to light.  Neck: Normal range of motion.  Neurological: She is alert and oriented to person, place, and time.  Skin: Skin is warm and dry.  Psychiatric: She has a normal mood and affect.  Symmetric normal motor tone is noted throughout. Normal muscle bulk. Muscle testing reveals 5/5 muscle strength of the upper extremity, and 5/5 of the lower extremity, except left hip flexion 4/5, mainly because of pain, and left gastrocnemius 4/5. Full range of motion in upper and lower extremities. ROM of spine is restricted.  DTR in the upper and lower extremity are present and symmetric 2+, except left achilles tendon reflex was  demineshed 1+. No clonus is noted.  Patient arises from chair without difficulty. Narrow based gait with normal arm swing bilateral.  Pain on palpating left trochanteric bursa.       Assessment & Plan:  Lumbar post laminectomy syndrome.  Chronic left lower extremity radicular pain.  MRI of her lumbar spine, showed that bone graft material is pinching on the left S1 nerve. TF-ESI gave her 40 percent relief. Referred her to neurosurgery for possible further treatment at last visit, they are working on to get her an appointment.  Continue medication , also continue walking program. Advised patient to pick up her aquatic aerobic as soon as she finds a pool close to her new home. Prescribed Voltaren gel for flaring up of her left trochanteric bursitis. Refilled her Oxycodone 5mg , # 90  Filled out disability forms during the visit Follow up in one month.

## 2011-12-09 ENCOUNTER — Telehealth: Payer: Self-pay

## 2011-12-09 NOTE — Telephone Encounter (Signed)
Patient had taken her husbands tramadol.  She said gabapentin has not been helping. She says when the weather changes it causes her to hurt more.  Patient says she is supposed to have bone marrow surgery to relieve her nerve pain?  She does use Voltaren gel and heat and ice to help the pain.  Gabapentin helps her nerve pain, burning in her feet.  Please advise.

## 2011-12-09 NOTE — Telephone Encounter (Signed)
Lm for patient to find out if she takes tramadol.  UDS inconsistent.

## 2011-12-24 ENCOUNTER — Encounter: Payer: Self-pay | Admitting: Physical Medicine and Rehabilitation

## 2011-12-29 ENCOUNTER — Encounter: Payer: Self-pay | Admitting: Physical Medicine and Rehabilitation

## 2011-12-29 ENCOUNTER — Encounter
Payer: Medicaid Other | Attending: Physical Medicine and Rehabilitation | Admitting: Physical Medicine and Rehabilitation

## 2011-12-29 VITALS — BP 130/84 | HR 96 | Ht 61.0 in | Wt 146.0 lb

## 2011-12-29 DIAGNOSIS — K219 Gastro-esophageal reflux disease without esophagitis: Secondary | ICD-10-CM | POA: Insufficient documentation

## 2011-12-29 DIAGNOSIS — M79609 Pain in unspecified limb: Secondary | ICD-10-CM | POA: Insufficient documentation

## 2011-12-29 DIAGNOSIS — M545 Low back pain, unspecified: Secondary | ICD-10-CM | POA: Insufficient documentation

## 2011-12-29 DIAGNOSIS — G8929 Other chronic pain: Secondary | ICD-10-CM | POA: Insufficient documentation

## 2011-12-29 DIAGNOSIS — M961 Postlaminectomy syndrome, not elsewhere classified: Secondary | ICD-10-CM

## 2011-12-29 MED ORDER — OXYCODONE HCL 5 MG PO TABS: 5.0000 mg | ORAL_TABLET | Freq: Three times a day (TID) | ORAL | Status: DC | PRN

## 2011-12-29 NOTE — Patient Instructions (Addendum)
Continue with walking as tolerated, advised patient to go back to exercising in the water as tolerated.

## 2011-12-29 NOTE — Progress Notes (Signed)
Subjective:    Patient ID: [name], female    DOB: 04-09-1960, 50 y.o.   MRN: [id number]  HPI The patient is a 51 year old female, who presents with LBP, which radiates into her posterior LE in a S1 distribution . 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 The symptoms have not improved after the surgeries, they have gotten worse. The patient complains about moderate to severe pain . Patient also complains about numbness and tingling in the same distribution . Applying heat, taking medications , doing aquatic exercises alleviate the symptoms. Prolonged standing , sitting aggrevates the symptoms. The problem has gotten a little better after a TF-ESI at S1 on the left, 40%. She is planning to see another spine surgeon for possible further treatment, soon. Patient has applied for social disability, and will have a hearing in December of 2013.   Pain Inventory Average Pain 7 Pain Right Now 6 My pain is constant, sharp, burning, dull, stabbing, tingling and aching  In the last 24 hours, has pain interfered with the following? General activity 8 Relation with others 8 Enjoyment of life 9 What TIME of day is your pain at its worst? all the time Sleep (in general) Fair  Pain is worse with: bending and sitting Pain improves with: rest, medication and injections Relief from Meds: 5  Mobility walk without assistance how many minutes can you walk? 30-45 ability to climb steps?  yes do you drive?  yes Do you have any goals in this area?  no  Function not employed: date last employed  I need assistance with the following:  meal prep, household duties and shopping Do you have any goals in this area?  yes  Neuro/Psych weakness numbness tingling spasms depression  Prior Studies Any changes since last visit?  no  Physicians involved in your care Any changes since last visit?  no   Family History  Problem Relation Age  of Onset  . Cancer Mother    History   Social History  . Marital Status: Married    Spouse Name: N/A    Number of Children: N/A  . Years of Education: N/A   Social History Main Topics  . Smoking status: Never Smoker   . Smokeless tobacco: Never Used  . Alcohol Use: No  . Drug Use: No  . Sexually Active: None   Other Topics Concern  . None   Social History Narrative  . None   Past Surgical History  Procedure Date  . Tonsilectomy, adenoidectomy, bilateral myringotomy and tubes 1980  . Nasal sinus surgery 2002  . Abdominal hysterectomy 2007  . Cholecystectomy 2009  . Carpal tunnel release 2010    LEFT HANDED SURGERY  . Lumbar fusion 2011  . Lumbar repair 2011  . Lumbar repair 2000   Past Medical History  Diagnosis Date  . GERD (gastroesophageal reflux disease)   . Lumbago    BP 130/84  Pulse 96  Ht 5\' 1"  (1.549 m)  Wt 146 lb (66.225 kg)  BMI 27.59 kg/m2  SpO2 100%     Review of Systems  Gastrointestinal: Positive for constipation.  Musculoskeletal: Positive for myalgias, back pain and arthralgias.  Neurological: Positive for weakness and numbness.  Psychiatric/Behavioral: Positive for dysphoric mood.  All other systems reviewed and are negative.       Objective:   Physical Exam Constitutional: She is oriented to person, place, and time. She  appears well-developed.  HENT:  Head: Normocephalic.  Eyes: Pupils are equal, round, and reactive to light.  Neck: Normal range of motion.  Neurological: She is alert and oriented to person, place, and time.  Skin: Skin is warm and dry.  Psychiatric: She has a normal mood and affect.  Symmetric normal motor tone is noted throughout. Normal muscle bulk. Muscle testing reveals 5/5 muscle strength of the upper extremity, and 5/5 of the lower extremity, except left hip flexion 4/5, mainly because of pain, and left gastrocnemius 4/5. Full range of motion in upper and lower extremities. ROM of spine is restricted.    DTR in the upper and lower extremity are present and symmetric 2+, except left achilles tendon reflex was demineshed 1+. No clonus is noted.  Patient arises from chair without difficulty. Narrow based gait with normal arm swing bilateral.          Assessment & Plan:  Lumbar post laminectomy syndrome.  Chronic left lower extremity radicular pain.  MRI of her lumbar spine, showed that bone graft material is pinching on the left S1 nerve. TF-ESI gave her 40 percent relief. Referred her to neurosurgery for possible further treatment at last visit, they denied her, because she does not have insurance.   Continue medication , also continue walking program. Advised patient to pick up her aquatic aerobic as soon as she finds a pool close to her new home. Refilled her Oxycodone 5mg , # 90  Filled out disability forms again during the visit  Follow up in one month.

## 2012-01-26 ENCOUNTER — Encounter: Payer: Self-pay | Admitting: Physical Medicine & Rehabilitation

## 2012-01-26 ENCOUNTER — Ambulatory Visit (HOSPITAL_BASED_OUTPATIENT_CLINIC_OR_DEPARTMENT_OTHER): Payer: Self-pay | Admitting: Physical Medicine & Rehabilitation

## 2012-01-26 ENCOUNTER — Encounter: Payer: Medicaid Other | Attending: Physical Medicine and Rehabilitation

## 2012-01-26 VITALS — BP 152/72 | HR 99 | Resp 14 | Ht 61.0 in | Wt 144.0 lb

## 2012-01-26 DIAGNOSIS — G8929 Other chronic pain: Secondary | ICD-10-CM | POA: Insufficient documentation

## 2012-01-26 DIAGNOSIS — M545 Low back pain, unspecified: Secondary | ICD-10-CM | POA: Insufficient documentation

## 2012-01-26 DIAGNOSIS — R209 Unspecified disturbances of skin sensation: Secondary | ICD-10-CM | POA: Insufficient documentation

## 2012-01-26 DIAGNOSIS — K219 Gastro-esophageal reflux disease without esophagitis: Secondary | ICD-10-CM | POA: Insufficient documentation

## 2012-01-26 DIAGNOSIS — IMO0002 Reserved for concepts with insufficient information to code with codable children: Secondary | ICD-10-CM | POA: Insufficient documentation

## 2012-01-26 DIAGNOSIS — M79609 Pain in unspecified limb: Secondary | ICD-10-CM | POA: Insufficient documentation

## 2012-01-26 DIAGNOSIS — M5416 Radiculopathy, lumbar region: Secondary | ICD-10-CM

## 2012-01-26 DIAGNOSIS — M961 Postlaminectomy syndrome, not elsewhere classified: Secondary | ICD-10-CM

## 2012-01-26 MED ORDER — DICLOFENAC SODIUM 1 % TD GEL: 2.0000 g | Freq: Four times a day (QID) | TRANSDERMAL | Status: DC

## 2012-01-26 MED ORDER — OXYCODONE HCL 5 MG PO TABS: 5.0000 mg | ORAL_TABLET | Freq: Three times a day (TID) | ORAL | Status: DC | PRN

## 2012-01-26 MED ORDER — GABAPENTIN 300 MG PO CAPS: 600.0000 mg | ORAL_CAPSULE | Freq: Three times a day (TID) | ORAL | Status: DC

## 2012-01-26 NOTE — Progress Notes (Signed)
Subjective:    Patient ID: [name], female    DOB: 1960-05-01, 51 y.o.   MRN: [id number]  HPI The patient is a 51 year old female, who presents with LBP, which radiates into her posterior LE in a S1 distribution . 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 The symptoms have not improved after the surgeries, they have gotten worse. The patient complains about moderate to severe pain . Patient also complains about numbness and tingling in the same distribution . Applying heat, taking medications , doing aquatic exercises alleviate the symptoms. Prolonged standing , sitting aggrevates the symptoms. The problem has gotten a little better after a TF-ESI at S1 on the left, 40%. She is planning to see another spine surgeon for possible further treatment, soon. Patient has applied for social disability, and will have a hearing in December of 2013.  Pain Inventory Average Pain 7 Pain Right Now 7 My pain is constant, sharp, burning, dull, stabbing, tingling and aching  In the last 24 hours, has pain interfered with the following? General activity 7 Relation with others 7 Enjoyment of life 7 What TIME of day is your pain at its worst? all the time Sleep (in general) Fair  Pain is worse with: walking, bending and sitting Pain improves with: rest, heat/ice and medication Relief from Meds: 5  Mobility how many minutes can you walk? 45 ability to climb steps?  yes Do you have any goals in this area?  yes  Function not employed: date last employed 03/2003 disabled: date disabled  I need assistance with the following:  meal prep, household duties and shopping Do you have any goals in this area?  yes  Neuro/Psych weakness numbness tingling spasms depression  Prior Studies Any changes since last visit?  no  Physicians involved in your care Any changes since last visit?  no   Family History  Problem Relation Age of Onset   . Cancer Mother    History   Social History  . Marital Status: Married    Spouse Name: N/A    Number of Children: N/A  . Years of Education: N/A   Social History Main Topics  . Smoking status: Never Smoker   . Smokeless tobacco: Never Used  . Alcohol Use: No  . Drug Use: No  . Sexually Active: None   Other Topics Concern  . None   Social History Narrative  . None   Past Surgical History  Procedure Date  . Tonsilectomy, adenoidectomy, bilateral myringotomy and tubes 1980  . Nasal sinus surgery 2002  . Abdominal hysterectomy 2007  . Cholecystectomy 2009  . Carpal tunnel release 2010    LEFT HANDED SURGERY  . Lumbar fusion 2011  . Lumbar repair 2011  . Lumbar repair 2000   Past Medical History  Diagnosis Date  . GERD (gastroesophageal reflux disease)   . Lumbago    BP 152/72  Pulse 99  Resp 14  Ht 5\' 1"  (1.549 m)  Wt 144 lb (65.318 kg)  BMI 27.21 kg/m2  SpO2 97%    Review of Systems  Musculoskeletal: Positive for back pain.  Neurological: Positive for weakness and numbness.       Tingling, spasms  Psychiatric/Behavioral: Positive for dysphoric mood.  All other systems reviewed and are negative.       Objective:   Physical Exam  Nursing note and vitals reviewed. Constitutional: She is oriented to person,  place, and time. She appears well-developed and well-nourished.  HENT:  Head: Normocephalic and atraumatic.  Musculoskeletal:       Right hip: Normal.       Left hip: Normal.       Lumbar back: She exhibits decreased range of motion and pain. She exhibits no tenderness, no swelling and no spasm.       Reduced range of motion flexion 50% extension 25% lateral rotation and lateral bending 25% Pain is mainly with extension  Neurological: She is alert and oriented to person, place, and time. She has normal strength. A sensory deficit is present. Gait normal.  Reflex Scores:      Tricep reflexes are 2+ on the right side and 2+ on the left side.       Bicep reflexes are 2+ on the right side and 2+ on the left side.      Brachioradialis reflexes are 2+ on the right side and 2+ on the left side.      Patellar reflexes are 2+ on the right side and 2+ on the left side.      Achilles reflexes are 2+ on the right side and 1+ on the left side.      Decreased sensation in the second through fifth toes of the left foot Negative straight leg raising test.   Psychiatric: She has a normal mood and affect.          Assessment & Plan:  Lumbar post laminectomy syndrome.  Chronic left lower extremity radicular pain.  MRI of her lumbar spine, showed that bone graft material is pinching on the left S1 nerve. TF-ESI gave her 40 percent relief. Continue medication , also continue walking program. Advised patient to pick up her aquatic aerobic as soon as she finds a pool close to her new home. Refilled her Oxycodone 5mg , # 90  Refilled gabapentin 300 mg 2 by mouth 3 times a day. She is unable to take the 600 mg tablet Refilled diclofenac gel Follow up in one month.Will repeat left S1 transforaminal injection

## 2012-02-02 NOTE — Telephone Encounter (Signed)
Educated patient at last visit

## 2012-02-24 ENCOUNTER — Encounter: Payer: Self-pay | Admitting: Physical Medicine & Rehabilitation

## 2012-02-24 ENCOUNTER — Ambulatory Visit (HOSPITAL_BASED_OUTPATIENT_CLINIC_OR_DEPARTMENT_OTHER): Payer: Self-pay | Admitting: Physical Medicine & Rehabilitation

## 2012-02-24 ENCOUNTER — Encounter: Payer: Medicaid Other | Attending: Physical Medicine and Rehabilitation

## 2012-02-24 VITALS — BP 137/82 | HR 93 | Resp 14 | Ht 61.0 in | Wt 147.6 lb

## 2012-02-24 DIAGNOSIS — IMO0002 Reserved for concepts with insufficient information to code with codable children: Secondary | ICD-10-CM | POA: Insufficient documentation

## 2012-02-24 DIAGNOSIS — M5416 Radiculopathy, lumbar region: Secondary | ICD-10-CM

## 2012-02-24 DIAGNOSIS — R209 Unspecified disturbances of skin sensation: Secondary | ICD-10-CM | POA: Insufficient documentation

## 2012-02-24 DIAGNOSIS — G8929 Other chronic pain: Secondary | ICD-10-CM | POA: Insufficient documentation

## 2012-02-24 DIAGNOSIS — M79609 Pain in unspecified limb: Secondary | ICD-10-CM | POA: Insufficient documentation

## 2012-02-24 DIAGNOSIS — M545 Low back pain, unspecified: Secondary | ICD-10-CM | POA: Insufficient documentation

## 2012-02-24 DIAGNOSIS — K219 Gastro-esophageal reflux disease without esophagitis: Secondary | ICD-10-CM | POA: Insufficient documentation

## 2012-02-24 DIAGNOSIS — M961 Postlaminectomy syndrome, not elsewhere classified: Secondary | ICD-10-CM | POA: Insufficient documentation

## 2012-02-24 MED ORDER — OXYCODONE HCL 5 MG PO TABS: 5.0000 mg | ORAL_TABLET | Freq: Three times a day (TID) | ORAL | Status: DC | PRN

## 2012-02-24 NOTE — Progress Notes (Signed)
Lumbosacral transforaminal epidural steroid injection under fluoroscopic guidance  Indication: Lumbosacral radiculitis is not relieved by medication management or other conservative care and interfering with self-care and mobility.   Informed consent was obtained after describing risk and benefits of the procedure with the patient, this includes bleeding, bruising, infection, paralysis and medication side effects.  The patient wishes to proceed and has given written consent.  Patient was placed in prone position.  The lumbar area was marked and prepped with Betadine.  It was entered with a 25-gauge 1-1/2 inch needle and one mL of 1% lidocaine was injected into the skin and subcutaneous tissue.  Then a 22-gauge 3.5 spinal needle was inserted into the Left S1 intervertebral foramen under AP, lateral, and oblique view.  Then a solution containing one mL of 10 mg per mL dexamethasone and 2 mL of 1% lidocaine was injected.  The patient tolerated procedure well.  Post procedure instructions were given.  Please see post procedure form.

## 2012-02-24 NOTE — Patient Instructions (Addendum)
PA visit with [name] in one month M.D. Visit with me for repeat injection in 3 months

## 2012-02-24 NOTE — Progress Notes (Signed)
  PROCEDURE RECORD [facility]   Name: KYANNA MAHRT DOB:May 21, 1960 MRN: [id number]  Date:02/24/2012  Physician: Claudette Laws, MD    Nurse/CMA: Dan Humphreys CMA  Allergies:  Allergies  Allergen Reactions  . Shellfish Allergy     Dx w/ allergy testing; allergy to shrimp  . Sulfa Drugs Cross Reactors Itching  . Codeine Itching  . Ivp Dye (Iodinated Diagnostic Agents) Itching    Consent Signed: yes  Is patient diabetic? no  CBG today?   Pregnant: no LMP: No LMP recorded. Patient has had a hysterectomy. (age 31-55)  Anticoagulants: no Anti-inflammatory: no Antibiotics: no  Procedure: Left S1 Transforaminal Lumbar Epidural Steroid Injection Position: Prone Start Time: 3:40 End Time: 3:444 Fluoro Time: 9  RN/CMA Architectural technologist    Time 3:07 3:47    BP 137/82 144/78    Pulse 93 90    Respirations 14 14    O2 Sat 95 99    S/S 6 6    Pain Level 7/10 6/10     D/C home with husband, patient A & O X 3, D/C instructions reviewed, and sits independently.

## 2012-03-10 ENCOUNTER — Telehealth: Payer: Self-pay

## 2012-03-10 NOTE — Telephone Encounter (Signed)
Schedule for EMG with me

## 2012-03-10 NOTE — Telephone Encounter (Signed)
Left VM to [facility] 

## 2012-03-10 NOTE — Telephone Encounter (Signed)
Left voice mail message to schedule EMG for this patient.  Pending phone call back

## 2012-03-10 NOTE — Telephone Encounter (Signed)
Patient called complaining of her leg and back hurting.  She got a shot in her back a few weeks ago and it doesn't seem to be working.  Please advise.

## 2012-03-10 NOTE — Telephone Encounter (Signed)
See Dr Wynn Banker reply. Schedule EMG 1st available

## 2012-03-10 NOTE — Telephone Encounter (Signed)
Pain is worse and is having to limp. Do you want [place] to bring her in for appointment? Please advise.

## 2012-03-15 ENCOUNTER — Ambulatory Visit (HOSPITAL_BASED_OUTPATIENT_CLINIC_OR_DEPARTMENT_OTHER): Payer: Self-pay | Admitting: Physical Medicine & Rehabilitation

## 2012-03-15 ENCOUNTER — Encounter: Payer: Medicaid Other | Attending: Physical Medicine and Rehabilitation

## 2012-03-15 ENCOUNTER — Encounter: Payer: Self-pay | Admitting: Physical Medicine & Rehabilitation

## 2012-03-15 VITALS — BP 136/85 | HR 94 | Resp 14 | Ht 61.0 in | Wt 150.0 lb

## 2012-03-15 DIAGNOSIS — IMO0002 Reserved for concepts with insufficient information to code with codable children: Secondary | ICD-10-CM

## 2012-03-15 DIAGNOSIS — M545 Low back pain, unspecified: Secondary | ICD-10-CM | POA: Insufficient documentation

## 2012-03-15 DIAGNOSIS — G8929 Other chronic pain: Secondary | ICD-10-CM | POA: Insufficient documentation

## 2012-03-15 DIAGNOSIS — M961 Postlaminectomy syndrome, not elsewhere classified: Secondary | ICD-10-CM | POA: Insufficient documentation

## 2012-03-15 DIAGNOSIS — M5416 Radiculopathy, lumbar region: Secondary | ICD-10-CM

## 2012-03-15 DIAGNOSIS — M79609 Pain in unspecified limb: Secondary | ICD-10-CM | POA: Insufficient documentation

## 2012-03-15 DIAGNOSIS — K219 Gastro-esophageal reflux disease without esophagitis: Secondary | ICD-10-CM | POA: Insufficient documentation

## 2012-03-15 DIAGNOSIS — R209 Unspecified disturbances of skin sensation: Secondary | ICD-10-CM | POA: Insufficient documentation

## 2012-03-15 MED ORDER — OXYCODONE HCL 5 MG PO TABS: 5.0000 mg | ORAL_TABLET | Freq: Three times a day (TID) | ORAL | Status: DC | PRN

## 2012-03-15 NOTE — Patient Instructions (Signed)
Next visit we will discuss possible neurosurgical referral We will also discuss the DVD on spinal cord stimulation We'll also discuss the EMG test

## 2012-03-15 NOTE — Progress Notes (Signed)
EMG performed 03/15/2012.  See EMG report under media tab.

## 2012-03-24 ENCOUNTER — Ambulatory Visit: Payer: Self-pay | Admitting: Physical Medicine and Rehabilitation

## 2012-04-12 ENCOUNTER — Encounter: Payer: BC Managed Care – PPO | Attending: Physical Medicine and Rehabilitation

## 2012-04-12 ENCOUNTER — Ambulatory Visit (HOSPITAL_BASED_OUTPATIENT_CLINIC_OR_DEPARTMENT_OTHER): Payer: Self-pay | Admitting: Physical Medicine & Rehabilitation

## 2012-04-12 ENCOUNTER — Encounter: Payer: Self-pay | Admitting: Physical Medicine & Rehabilitation

## 2012-04-12 VITALS — BP 160/91 | HR 96 | Resp 15 | Ht 61.0 in | Wt 151.0 lb

## 2012-04-12 DIAGNOSIS — R209 Unspecified disturbances of skin sensation: Secondary | ICD-10-CM | POA: Insufficient documentation

## 2012-04-12 DIAGNOSIS — K219 Gastro-esophageal reflux disease without esophagitis: Secondary | ICD-10-CM | POA: Insufficient documentation

## 2012-04-12 DIAGNOSIS — M549 Dorsalgia, unspecified: Secondary | ICD-10-CM

## 2012-04-12 DIAGNOSIS — M79609 Pain in unspecified limb: Secondary | ICD-10-CM | POA: Insufficient documentation

## 2012-04-12 DIAGNOSIS — IMO0002 Reserved for concepts with insufficient information to code with codable children: Secondary | ICD-10-CM

## 2012-04-12 DIAGNOSIS — M545 Low back pain, unspecified: Secondary | ICD-10-CM | POA: Insufficient documentation

## 2012-04-12 DIAGNOSIS — M961 Postlaminectomy syndrome, not elsewhere classified: Secondary | ICD-10-CM | POA: Insufficient documentation

## 2012-04-12 DIAGNOSIS — M5416 Radiculopathy, lumbar region: Secondary | ICD-10-CM

## 2012-04-12 DIAGNOSIS — Z5181 Encounter for therapeutic drug level monitoring: Secondary | ICD-10-CM

## 2012-04-12 DIAGNOSIS — G8929 Other chronic pain: Secondary | ICD-10-CM | POA: Insufficient documentation

## 2012-04-12 MED ORDER — OXYCODONE HCL 5 MG PO TABS: 5.0000 mg | ORAL_TABLET | Freq: Four times a day (QID) | ORAL | Status: DC | PRN

## 2012-04-12 NOTE — Progress Notes (Signed)
Subjective:    Patient ID: [name], female    DOB: [birth year], 52 y.o.   MRN: [id number]  HPI  Complains of pain in the low back, left buttock radiating into the left great toe. Previously was complaining of pain going mainly into the little toe. No new trauma. Recent EMG reviewed which was normal. Patient requesting referral to [facility] based spine surgeon.  Pain Inventory Average Pain 7 Pain Right Now 7 My pain is constant, sharp, burning, dull, stabbing, tingling and aching  In the last 24 hours, has pain interfered with the following? General activity 8 Relation with others 7 Enjoyment of life 8 What TIME of day is your pain at its worst? morning,day time, evening, night time Sleep (in general) Fair  Pain is worse with: walking, bending, sitting and standing Pain improves with: rest, heat/ice and medication Relief from Meds: 5  Mobility walk without assistance how many minutes can you walk? 30 min ability to climb steps?  yes do you drive?  no Do you have any goals in this area?  yes  Function disabled: date disabled 01-13-2012 I need assistance with the following:  meal prep, household duties and shopping Do you have any goals in this area?  yes  Neuro/Psych weakness numbness tingling spasms depression  Prior Studies Any changes since last visit?  no  Physicians involved in your care Any changes since last visit?  no   Family History  Problem Relation Age of Onset  . Cancer Mother    History   Social History  . Marital Status: Married    Spouse Name: N/A    Number of Children: N/A  . Years of Education: N/A   Social History Main Topics  . Smoking status: Never Smoker   . Smokeless tobacco: Never Used  . Alcohol Use: No  . Drug Use: No  . Sexually Active: None   Other Topics Concern  . None   Social History Narrative  . None   Past Surgical History  Procedure Laterality Date  . Tonsilectomy, adenoidectomy, bilateral  myringotomy and tubes  1980  . Nasal sinus surgery  2002  . Abdominal hysterectomy  2007  . Cholecystectomy  2009  . Carpal tunnel release  2010    LEFT HANDED SURGERY  . Lumbar fusion  2011  . Lumbar repair  2011  . Lumbar repair  2000   Past Medical History  Diagnosis Date  . GERD (gastroesophageal reflux disease)   . Lumbago    BP 160/91  Pulse 96  Resp 15  Ht 5\' 1"  (1.549 m)  Wt 151 lb (68.493 kg)  BMI 28.55 kg/m2  SpO2 99%      Review of Systems  Gastrointestinal: Positive for constipation.  Neurological: Positive for weakness and numbness.       Spasms,tingling  Psychiatric/Behavioral: Positive for dysphoric mood.  All other systems reviewed and are negative.       Objective:   Physical Exam  Nursing note and vitals reviewed. Constitutional: She is oriented to person, place, and time. She appears well-developed and well-nourished.  Neurological: She is alert and oriented to person, place, and time. She has normal strength. She displays no atrophy. A sensory deficit is present. Gait abnormal.  Reflex Scores:      Patellar reflexes are 2+ on the right side and 2+ on the left side.      Achilles reflexes are 2+ on the right side and 2+ on the left side. Psychiatric: She has  a normal mood and affect.   Decreased sensation left great toe to pinprick       Assessment & Plan:  Lumbar post laminectomy syndrome.  Chronic left lower extremity radicular pain.  MRI of her lumbar spine, showed that bone graft material is pinching on the left S1 nerve. Her exam has changed somewhat now the L5 distribution has pain. EMG was normal. Patient requesting referral to [facility], will refer to Spine Center in [facility].Gwenette Greet gave her 40 percent relief. Continue medication , also continue walking program. Advised patient to pick up her aquatic aerobic as soon as she finds a pool close to her new home. Refilled her Oxycodone 5mg ,increase to  # 120 per  month RTC one month Refilled gabapentin 300 mg 2 by mouth 3 times a day. She is unable to take the 600 mg tablet Refilled diclofenac gel

## 2012-04-16 ENCOUNTER — Telehealth: Payer: Self-pay

## 2012-04-16 NOTE — Telephone Encounter (Signed)
The patient may try over-the-counter aspercreme

## 2012-04-16 NOTE — Telephone Encounter (Signed)
Patient called to get status of voltaren gel request.  Tried to contact patient.  This medication has been denied by her insurance.  Please advise other options.

## 2012-04-19 NOTE — Telephone Encounter (Signed)
Patient returned call

## 2012-04-19 NOTE — Telephone Encounter (Signed)
Left message advising patient to call back.

## 2012-04-30 ENCOUNTER — Telehealth: Payer: Self-pay

## 2012-04-30 NOTE — Telephone Encounter (Signed)
Patient called concerned about a referral to [facility].  She is having trouble getting in touch with [facility] and would like a referral to another facility.  Please advise.

## 2012-05-03 NOTE — Telephone Encounter (Signed)
Left message regarding referral request.

## 2012-05-04 NOTE — Telephone Encounter (Signed)
Left message for patient to call back regarding her referral request.

## 2012-05-11 ENCOUNTER — Ambulatory Visit (HOSPITAL_BASED_OUTPATIENT_CLINIC_OR_DEPARTMENT_OTHER): Payer: Self-pay | Admitting: Physical Medicine & Rehabilitation

## 2012-05-11 ENCOUNTER — Encounter: Payer: BC Managed Care – PPO | Attending: Physical Medicine & Rehabilitation

## 2012-05-11 ENCOUNTER — Encounter: Payer: Self-pay | Admitting: Physical Medicine & Rehabilitation

## 2012-05-11 VITALS — BP 135/78 | HR 83 | Resp 14 | Ht 61.0 in | Wt 152.0 lb

## 2012-05-11 DIAGNOSIS — IMO0002 Reserved for concepts with insufficient information to code with codable children: Secondary | ICD-10-CM | POA: Insufficient documentation

## 2012-05-11 DIAGNOSIS — M961 Postlaminectomy syndrome, not elsewhere classified: Secondary | ICD-10-CM | POA: Insufficient documentation

## 2012-05-11 DIAGNOSIS — M5416 Radiculopathy, lumbar region: Secondary | ICD-10-CM

## 2012-05-11 DIAGNOSIS — K219 Gastro-esophageal reflux disease without esophagitis: Secondary | ICD-10-CM | POA: Insufficient documentation

## 2012-05-11 MED ORDER — DICLOFENAC EPOLAMINE 1.3 % TD PTCH: 1.0000 | MEDICATED_PATCH | Freq: Two times a day (BID) | TRANSDERMAL | Status: DC

## 2012-05-11 MED ORDER — OXYCODONE HCL 5 MG PO TABS: 5.0000 mg | ORAL_TABLET | Freq: Four times a day (QID) | ORAL | Status: DC | PRN

## 2012-05-11 MED ORDER — DICLOFENAC EPOLAMINE 1.3 % TD PTCH: 1.0000 | MEDICATED_PATCH | Freq: Two times a day (BID) | TRANSDERMAL | Status: AC

## 2012-05-11 NOTE — Progress Notes (Signed)
Subjective:    Patient ID: [name], female    DOB: 22-Sep-1960, 52 y.o.   MRN: [id number]  HPI Still trying to get into the [facility] spine Center. Complaining of left lower extremity nerve pain. Also considering spinal cord stimulation. Pain medications Counted, appropriate use. Continues on gabapentin. Pain Inventory Average Pain 7 Pain Right Now 7 My pain is constant, sharp, burning, dull, stabbing, tingling and aching  In the last 24 hours, has pain interfered with the following? General activity 7 Relation with others 7 Enjoyment of life 7 What TIME of day is your pain at its worst? all the time Sleep (in general) Fair  Pain is worse with: bending, sitting and standing Pain improves with: rest, heat/ice and medication Relief from Meds: 6  Mobility walk without assistance how many minutes can you walk? 30 ability to climb steps?  yes do you drive?  no Do you have any goals in this area?  yes  Function disabled: date disabled 01/13/2012 I need assistance with the following:  meal prep, household duties and shopping  Neuro/Psych weakness numbness tingling spasms depression  Prior Studies Any changes since last visit?  no  Physicians involved in your care Any changes since last visit?  no   Family History  Problem Relation Age of Onset  . Cancer Mother    History   Social History  . Marital Status: Married    Spouse Name: N/A    Number of Children: N/A  . Years of Education: N/A   Social History Main Topics  . Smoking status: Never Smoker   . Smokeless tobacco: Never Used  . Alcohol Use: No  . Drug Use: No  . Sexually Active: None   Other Topics Concern  . None   Social History Narrative  . None   Past Surgical History  Procedure Laterality Date  . Tonsilectomy, adenoidectomy, bilateral myringotomy and tubes  1980  . Nasal sinus surgery  2002  . Abdominal hysterectomy  2007  . Cholecystectomy  2009  . Carpal tunnel  release  2010    LEFT HANDED SURGERY  . Lumbar fusion  2011  . Lumbar repair  2011  . Lumbar repair  2000   Past Medical History  Diagnosis Date  . GERD (gastroesophageal reflux disease)   . Lumbago    BP 135/78  Pulse 83  Resp 14  Ht 5\' 1"  (1.549 m)  Wt 152 lb (68.947 kg)  BMI 28.74 kg/m2  SpO2 99%     Review of Systems  Gastrointestinal: Positive for nausea and constipation.  Musculoskeletal: Positive for back pain.  Neurological: Positive for weakness and numbness.  Psychiatric/Behavioral: Positive for dysphoric mood.  All other systems reviewed and are negative.       Objective:   Physical Exam Nursing note and vitals reviewed.  Constitutional: She is oriented to person, place, and time. She appears well-developed and well-nourished.  Neurological: She is alert and oriented to person, place, and time. She has normal strength. She displays no atrophy. A sensory deficit is present. Gait abnormal.  Reflex Scores:  Patellar reflexes are 2+ on the right side and 2+ on the left side.  Achilles reflexes are 2+ on the right side and 1 on the left side. Psychiatric: She has a normal mood and affect.   Decreased sensation left great toe,Left fifth digit, left ankle to pinprick Normal proprioception Normal strength in both lower extremities. Some giveaway weakness in the left ankle dorsiflexor Ambulation is  normal Lumbar spine with reduced range of motion in all directions       Assessment & Plan:  Lumbar post laminectomy syndrome.  Chronic left lower extremity radicular pain.  MRI of her lumbar spine, showed that bone graft material is pinching on the left S1 nerve. Her exam has changed somewhat now the L5 distribution has pain. EMG was normal. Patient requesting referral to [facility] based [facility], will refer to Spine Center in [facility].Gwenette Greet gave her 40 percent relief. Continue medication , also continue walking program. Advised patient to pick up her  aquatic aerobic as soon as she finds a pool close to her new home. Refilled her Oxycodone 5mg ,increase to # 120 per month  RTC one month  Refilled gabapentin 300 mg 2 by mouth 3 times a day. She is unable to take the 600 mg tablet  Refilled diclofenac gel

## 2012-05-11 NOTE — Patient Instructions (Signed)
Treatment options include Surgical second opinion at [facility] Spinal cord stimulation Changing pain medicines to a long-acting

## 2012-06-01 ENCOUNTER — Ambulatory Visit (HOSPITAL_BASED_OUTPATIENT_CLINIC_OR_DEPARTMENT_OTHER): Payer: BC Managed Care – PPO | Admitting: Physical Medicine & Rehabilitation

## 2012-06-01 ENCOUNTER — Encounter: Payer: Self-pay | Admitting: Physical Medicine & Rehabilitation

## 2012-06-01 VITALS — BP 133/72 | HR 80 | Resp 14 | Ht 61.0 in | Wt 151.0 lb

## 2012-06-01 DIAGNOSIS — M7061 Trochanteric bursitis, right hip: Secondary | ICD-10-CM

## 2012-06-01 DIAGNOSIS — IMO0002 Reserved for concepts with insufficient information to code with codable children: Secondary | ICD-10-CM

## 2012-06-01 DIAGNOSIS — M76899 Other specified enthesopathies of unspecified lower limb, excluding foot: Secondary | ICD-10-CM

## 2012-06-01 DIAGNOSIS — M961 Postlaminectomy syndrome, not elsewhere classified: Secondary | ICD-10-CM

## 2012-06-01 DIAGNOSIS — M5416 Radiculopathy, lumbar region: Secondary | ICD-10-CM

## 2012-06-01 MED ORDER — OXYCODONE HCL 5 MG PO TABS: 5.0000 mg | ORAL_TABLET | Freq: Four times a day (QID) | ORAL | Status: DC | PRN

## 2012-06-01 NOTE — Progress Notes (Signed)
Subjective:    Patient ID: [name], female    DOB: 1960/07/26, 52 y.o.   MRN: [id number]  HPI Still trying to get into the [facility] spine Center.  Complaining of left lower extremity nerve pain.  Also considering spinal cord stimulation.  Pain medications Counted, appropriate use.  Continues on gabapentin.  Pain Inventory Average Pain 7 Pain Right Now 7 My pain is constant, sharp, burning, dull, stabbing, tingling and aching  In the last 24 hours, has pain interfered with the following? General activity 7 Relation with others 7 Enjoyment of life 8 What TIME of day is your pain at its worst? all the time Sleep (in general) Fair  Pain is worse with: bending, sitting and standing Pain improves with: rest, heat/ice and medication Relief from Meds: 5  Mobility walk without assistance how many minutes can you walk? 30-45 ability to climb steps?  yes do you drive?  no Do you have any goals in this area?  yes  Function disabled: date disabled 2013 I need assistance with the following:  meal prep, household duties and shopping Do you have any goals in this area?  yes  Neuro/Psych numbness tingling spasms depression  Prior Studies Any changes since last visit?  no  Physicians involved in your care Any changes since last visit?  no   Family History  Problem Relation Age of Onset  . Cancer Mother    History   Social History  . Marital Status: Married    Spouse Name: N/A    Number of Children: N/A  . Years of Education: N/A   Social History Main Topics  . Smoking status: Never Smoker   . Smokeless tobacco: Never Used  . Alcohol Use: No  . Drug Use: No  . Sexually Active: None   Other Topics Concern  . None   Social History Narrative  . None   Past Surgical History  Procedure Laterality Date  . Tonsilectomy, adenoidectomy, bilateral myringotomy and tubes  1980  . Nasal sinus surgery  2002  . Abdominal hysterectomy  2007  .  Cholecystectomy  2009  . Carpal tunnel release  2010    LEFT HANDED SURGERY  . Lumbar fusion  2011  . Lumbar repair  2011  . Lumbar repair  2000   Past Medical History  Diagnosis Date  . GERD (gastroesophageal reflux disease)   . Lumbago    BP 133/72  Pulse 80  Resp 14  Ht 5\' 1"  (1.549 m)  Wt 151 lb (68.493 kg)  BMI 28.55 kg/m2  SpO2 98%     Review of Systems  Gastrointestinal: Positive for nausea and constipation.  Neurological: Positive for numbness.  Psychiatric/Behavioral: Positive for dysphoric mood.  All other systems reviewed and are negative.       Objective:   Physical Exam  Nursing note and vitals reviewed.  Nursing note and vitals reviewed.  Constitutional: She is oriented to person, place, and time. She appears well-developed and well-nourished.  Neurological: She is alert and oriented to person, place, and time. She has normal strength. She displays no atrophy. . Gait abnormal.  Reflex Scores:  Patellar reflexes are 2+ on the right side and 2+ on the left side.  Achilles reflexes are 2+ on the right side and 1 on the left side. Psychiatric: She has a normal mood and affect.  Decreased sensation left great toe,Left fifth digit, left ankle to pinprick  Normal proprioception  Normal strength in both lower extremities.  Some giveaway weakness in the left ankle dorsiflexor  Ambulation is normal  Lumbar spine with reduced range of motion in all directions Tenderness to palpation over the right greater trochanter       Assessment & Plan:  Lumbar post laminectomy syndrome.  Chronic left lower extremity radicular pain.  MRI of her lumbar spine, showed that bone graft material is pinching on the left S1 nerve. Her exam has changed somewhat now the L5 distribution has pain. EMG was normal. Patient requesting referral to [facility], will refer to Spine Center in [facility].Gwenette Greet gave her 40 percent relief. Continue medication , also  continue walking program. Advised patient to pick up her aquatic aerobic as soon as she finds a pool close to her new home. Refilled her Oxycodone 5mg , # 120 per month  RTC one month  Refilled gabapentin 300 mg 2 by mouth 3 times a day. She is unable to take the 600 mg tablet  Await insurance approval for diclofenac gel 2. Right trochanteric bursitis. Will repeat injection has had good relief in the past. Pain persist despite medication management including narcotic analgesics,As well as over-the-counter nonsteroidal anti-inflammatories  Right trochanteric bursa injection Informed consent was obtained after describing risks and benefits of the procedure with the patient his include bleeding bruising and infection she elects to proceed and has given written consent patient placed in a left lateral decubitus position very marked and prepped with Betadine alcohol and then entered with a 25-gauge 1.5 inch needle injected bone contact and slightly withdrawn. Then a solution containing one mL 40 mg Depo-Medrol and 4 mL 1% lidocaine. Patient tolerated procedure well. Post procedure instructions given. Indication

## 2012-06-01 NOTE — Patient Instructions (Signed)
Get medical records from the orthopedic surgery office you can bring them to the [facility] Continue current medications Walk and stay active as much as possible Return to clinic 4 weeks We did an injection of the right hip today for bursitis.

## 2012-06-08 ENCOUNTER — Ambulatory Visit: Payer: Self-pay | Admitting: Physical Medicine & Rehabilitation

## 2012-06-22 ENCOUNTER — Telehealth: Payer: Self-pay

## 2012-06-22 NOTE — Telephone Encounter (Signed)
Patient called complaining of pain in her left buttocks.  She says if feels like the screw is digging in.  This caused her leg to give out the other day.  Patient would like to get an MRI/CT before her next appointment so that the results can be discussed. Please advise.

## 2012-06-22 NOTE — Telephone Encounter (Signed)
Left message for patient to contact surgeons office regarding images.

## 2012-06-22 NOTE — Telephone Encounter (Signed)
This would be better done at surgeon's institution so they have full access and more info

## 2012-06-29 ENCOUNTER — Ambulatory Visit: Payer: Self-pay | Admitting: Physical Medicine & Rehabilitation

## 2012-06-29 ENCOUNTER — Ambulatory Visit: Payer: Medicaid Other | Admitting: Physical Medicine & Rehabilitation

## 2012-06-30 ENCOUNTER — Encounter: Payer: Self-pay | Admitting: Physical Medicine and Rehabilitation

## 2012-06-30 ENCOUNTER — Encounter
Payer: BC Managed Care – PPO | Attending: Physical Medicine and Rehabilitation | Admitting: Physical Medicine and Rehabilitation

## 2012-06-30 VITALS — BP 140/94 | HR 92 | Resp 14 | Ht 61.0 in | Wt 149.0 lb

## 2012-06-30 DIAGNOSIS — G8929 Other chronic pain: Secondary | ICD-10-CM | POA: Insufficient documentation

## 2012-06-30 DIAGNOSIS — M961 Postlaminectomy syndrome, not elsewhere classified: Secondary | ICD-10-CM | POA: Insufficient documentation

## 2012-06-30 DIAGNOSIS — M79609 Pain in unspecified limb: Secondary | ICD-10-CM | POA: Insufficient documentation

## 2012-06-30 MED ORDER — METHOCARBAMOL 500 MG PO TABS: 500.0000 mg | ORAL_TABLET | Freq: Three times a day (TID) | ORAL | Status: DC

## 2012-06-30 MED ORDER — OXYCODONE HCL 5 MG PO TABS: 5.0000 mg | ORAL_TABLET | Freq: Four times a day (QID) | ORAL | Status: DC | PRN

## 2012-06-30 NOTE — Patient Instructions (Addendum)
Continue with your walking program. Try to find out whether your insurance supports the [employer] program, where they would pay for a gym membership. You could try Arnica cream for your joint or muscle pain

## 2012-06-30 NOTE — Progress Notes (Signed)
Subjective:    Patient ID: [name], female    DOB: 18-Aug-1960, 52 y.o.   MRN: [id number]  HPI The patient is a 52 year old female, who presents with LBP, which radiates into her posterior LE in a S1 distribution . 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 The symptoms have not improved after the surgeries, they have gotten worse. The patient complains about moderate to severe pain . Patient also complains about numbness and tingling in the same distribution . Applying heat, taking medications , doing aquatic exercises alleviate the symptoms. Prolonged standing , sitting aggrevates the symptoms.  She saw another spine surgeon at [facility] for possible further treatment,but he did not have any of her notes/images etc, when she saw him and they basically did not have a good visit, she would like to see another surgeon in this area.  Patient has been approved for social disability. Otherwise the problem has been stable.   Pain Inventory Average Pain 8 Pain Right Now 8 My pain is constant, sharp, burning, dull, stabbing, tingling and aching  In the last 24 hours, has pain interfered with the following? General activity 7 Relation with others 7 Enjoyment of life 8 What TIME of day is your pain at its worst? constant Sleep (in general) Fair  Pain is worse with: walking, bending, sitting and standing Pain improves with: rest, heat/ice and medication Relief from Meds: 5  Mobility walk with assistance how many minutes can you walk? 30 ability to climb steps?  yes do you drive?  no Do you have any goals in this area?  yes  Function disabled: date disabled 2013 I need assistance with the following:  meal prep, household duties and shopping Do you have any goals in this area?  yes  Neuro/Psych tingling spasms  Prior Studies bone scan x-rays CT/MRI nerve study  Physicians involved in your care Any changes since last visit?   no   Family History  Problem Relation Age of Onset  . Cancer Mother    History   Social History  . Marital Status: Married    Spouse Name: N/A    Number of Children: N/A  . Years of Education: N/A   Social History Main Topics  . Smoking status: Never Smoker   . Smokeless tobacco: Never Used  . Alcohol Use: No  . Drug Use: No  . Sexually Active: None   Other Topics Concern  . None   Social History Narrative  . None   Past Surgical History  Procedure Laterality Date  . Tonsilectomy, adenoidectomy, bilateral myringotomy and tubes  1980  . Nasal sinus surgery  2002  . Abdominal hysterectomy  2007  . Cholecystectomy  2009  . Carpal tunnel release  2010    LEFT HANDED SURGERY  . Lumbar fusion  2011  . Lumbar repair  2011  . Lumbar repair  2000   Past Medical History  Diagnosis Date  . GERD (gastroesophageal reflux disease)   . Lumbago    BP 140/94  Pulse 92  Resp 14  Ht 5\' 1"  (1.549 m)  Wt 149 lb (67.586 kg)  BMI 28.17 kg/m2  SpO2 99%     Review of Systems  Gastrointestinal: Positive for constipation.  Musculoskeletal: Positive for back pain.  All other systems reviewed and are negative.       Objective:   Physical Exam Constitutional: She is oriented to person,  place, and time. She appears well-developed.  HENT:  Head: Normocephalic.  Eyes: Pupils are equal, round, and reactive to light.  Neck: Normal range of motion.  Neurological: She is alert and oriented to person, place, and time.  Skin: Skin is warm and dry.  Psychiatric: She has a normal mood and affect.  Symmetric normal motor tone is noted throughout. Normal muscle bulk. Muscle testing reveals 5/5 muscle strength of the upper extremity, and 5/5 of the lower extremity, except left hip flexion 4/5, mainly because of pain, and left gastrocnemius 4/5. Full range of motion in upper and lower extremities. ROM of spine is restricted.  DTR in the upper and lower extremity are present and  symmetric 2+, except left achilles tendon reflex was demineshed 1+. No clonus is noted.  Patient arises from chair without difficulty. Narrow based gait with normal arm swing bilateral.         Assessment & Plan:  Lumbar post laminectomy syndrome.  Chronic left lower extremity radicular pain.  MRI of her lumbar spine, showed that bone graft material is pinching on the left S1 nerve. TF-ESI gave her 40 percent relief at first, but does not give her relief anymore. Referred her to another  neurosurgeon for possible further treatment, she signed a release form for her notes, and also asked me to talk to the surgeon about her case.  Continue medication , also continue walking program. Advised patient to pick up her aquatic aerobic as soon as she finds a pool close to her new home. Refilled her Oxycodone 5mg , # 120   Follow up in one month.

## 2012-07-29 ENCOUNTER — Encounter: Payer: BC Managed Care – PPO | Admitting: Physical Medicine and Rehabilitation

## 2012-08-02 ENCOUNTER — Encounter
Payer: BC Managed Care – PPO | Attending: Physical Medicine and Rehabilitation | Admitting: Physical Medicine and Rehabilitation

## 2012-08-02 ENCOUNTER — Encounter: Payer: Self-pay | Admitting: Physical Medicine and Rehabilitation

## 2012-08-02 VITALS — BP 137/78 | HR 94 | Resp 16 | Ht 61.0 in | Wt 147.0 lb

## 2012-08-02 DIAGNOSIS — M545 Low back pain, unspecified: Secondary | ICD-10-CM | POA: Insufficient documentation

## 2012-08-02 DIAGNOSIS — Z981 Arthrodesis status: Secondary | ICD-10-CM | POA: Insufficient documentation

## 2012-08-02 DIAGNOSIS — M961 Postlaminectomy syndrome, not elsewhere classified: Secondary | ICD-10-CM | POA: Insufficient documentation

## 2012-08-02 DIAGNOSIS — M79609 Pain in unspecified limb: Secondary | ICD-10-CM | POA: Insufficient documentation

## 2012-08-02 DIAGNOSIS — G8929 Other chronic pain: Secondary | ICD-10-CM | POA: Insufficient documentation

## 2012-08-02 MED ORDER — OXYCODONE HCL 5 MG PO TABS: 5.0000 mg | ORAL_TABLET | Freq: Four times a day (QID) | ORAL | Status: DC | PRN

## 2012-08-02 NOTE — Progress Notes (Signed)
Subjective:    Patient ID: [name], female    DOB: 07/14/1960, 52 y.o.   MRN: [id number]  HPI The patient is a 52 year old female, who presents with LBP, which radiates into her posterior LE in a S1 distribution . 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 The symptoms have not improved after the surgeries, they have gotten worse. The patient complains about moderate to severe pain . Patient also complains about numbness and tingling in the same distribution . Applying heat, taking medications , doing aquatic exercises alleviate the symptoms. Prolonged standing , sitting aggrevates the symptoms. She saw another spine surgeon at [facility] for possible further treatment,but he did not have any of her notes/images etc, when she saw him and they basically did not have a good visit, she wanted to see another surgeon in this area. I referred her to Dr. Noel Gerold, she has an appointment on July the 9th.  Patient has been approved for social disability.  Otherwise the problem has been stable.  Pain Inventory Average Pain 7 Pain Right Now 7 My pain is constant, sharp, burning, dull, stabbing, tingling and aching  In the last 24 hours, has pain interfered with the following? General activity 7 Relation with others 7 Enjoyment of life 10 What TIME of day is your pain at its worst? constant Sleep (in general) NA  Pain is worse with: walking, bending, sitting, standing and some activites Pain improves with: rest, heat/ice and medication Relief from Meds: 6  Mobility walk without assistance how many minutes can you walk? 30 ability to climb steps?  yes do you drive?  no Do you have any goals in this area?  yes  Function disabled: date disabled 01-13-2012 I need assistance with the following:  meal prep, household duties and shopping Do you have any goals in this area?   yes  Neuro/Psych weakness numbness tingling spasms depression  Prior Studies Any changes since last visit?  no  Physicians involved in your care Any changes since last visit?  no   Family History  Problem Relation Age of Onset  . Cancer Mother    History   Social History  . Marital Status: Married    Spouse Name: N/A    Number of Children: N/A  . Years of Education: N/A   Social History Main Topics  . Smoking status: Never Smoker   . Smokeless tobacco: Never Used  . Alcohol Use: No  . Drug Use: No  . Sexually Active: None   Other Topics Concern  . None   Social History Narrative  . None   Past Surgical History  Procedure Laterality Date  . Tonsilectomy, adenoidectomy, bilateral myringotomy and tubes  1980  . Nasal sinus surgery  2002  . Abdominal hysterectomy  2007  . Cholecystectomy  2009  . Carpal tunnel release  2010    LEFT HANDED SURGERY  . Lumbar fusion  2011  . Lumbar repair  2011  . Lumbar repair  2000   Past Medical History  Diagnosis Date  . GERD (gastroesophageal reflux disease)   . Lumbago    BP 137/78  Pulse 94  Resp 16  Ht 5\' 1"  (1.549 m)  Wt 147 lb (66.679 kg)  BMI 27.79 kg/m2  SpO2 97%     Review of Systems  Gastrointestinal: Positive for constipation.  Neurological: Positive for weakness and numbness.  Spasms,tingling   Psychiatric/Behavioral: Positive for dysphoric mood.  All other systems reviewed and are negative.       Objective:   Physical Exam Constitutional: She is oriented to person, place, and time. She appears well-developed.  HENT:  Head: Normocephalic.  Eyes: Pupils are equal, round, and reactive to light.  Neck: Normal range of motion.  Neurological: She is alert and oriented to person, place, and time.  Skin: Skin is warm and dry.  Psychiatric: She has a normal mood and affect.  Symmetric normal motor tone is noted throughout. Normal muscle bulk. Muscle testing reveals 5/5 muscle strength of  the upper extremity, and 5/5 of the lower extremity, except left hip flexion 4/5, mainly because of pain, and left gastrocnemius 4/5. Full range of motion in upper and lower extremities. ROM of spine is restricted.  DTR in the upper and lower extremity are present and symmetric 2+, except left achilles tendon reflex was demineshed 1+. No clonus is noted.  Patient arises from chair without difficulty. Narrow based gait with normal arm swing bilateral.         Assessment & Plan:  Lumbar post laminectomy syndrome.  Chronic left lower extremity radicular pain.  MRI of her lumbar spine, showed that bone graft material is pinching on the left S1 nerve. TF-ESI gave her 40 percent relief at first, but does not give her relief anymore. Referred her to another spine surgeon for possible further treatment, she signed a release form for her notes, and also asked me to talk to the surgeon about her case, which I did. She has an appointment with Dr. Noel Gerold on 08/11/12  Continue medication , also continue walking program. Advised patient to pick up her aquatic aerobic as soon as she finds a pool close to her new home. Refilled her Oxycodone 5mg , # 120  Follow up in one month.

## 2012-08-02 NOTE — Patient Instructions (Signed)
Try to get back into exercising in the water.

## 2012-08-17 ENCOUNTER — Other Ambulatory Visit: Payer: Self-pay | Admitting: Orthopaedic Surgery

## 2012-08-17 DIAGNOSIS — M545 Low back pain: Secondary | ICD-10-CM

## 2012-08-27 ENCOUNTER — Inpatient Hospital Stay
Admission: RE | Admit: 2012-08-27 | Discharge: 2012-08-27 | Disposition: A | Discharge status: Home / Self Care | Payer: BC Managed Care – PPO | Source: Ambulatory Visit | Attending: Orthopaedic Surgery | Admitting: Orthopaedic Surgery

## 2012-08-27 ENCOUNTER — Other Ambulatory Visit: Payer: BC Managed Care – PPO

## 2012-08-31 ENCOUNTER — Encounter: Payer: Self-pay | Admitting: Physical Medicine and Rehabilitation

## 2012-08-31 ENCOUNTER — Encounter
Payer: BC Managed Care – PPO | Attending: Physical Medicine and Rehabilitation | Admitting: Physical Medicine and Rehabilitation

## 2012-08-31 VITALS — BP 142/72 | HR 118 | Resp 16 | Ht 61.0 in | Wt 145.0 lb

## 2012-08-31 DIAGNOSIS — G8929 Other chronic pain: Secondary | ICD-10-CM | POA: Insufficient documentation

## 2012-08-31 DIAGNOSIS — G589 Mononeuropathy, unspecified: Secondary | ICD-10-CM | POA: Insufficient documentation

## 2012-08-31 DIAGNOSIS — M79609 Pain in unspecified limb: Secondary | ICD-10-CM | POA: Insufficient documentation

## 2012-08-31 DIAGNOSIS — M961 Postlaminectomy syndrome, not elsewhere classified: Secondary | ICD-10-CM

## 2012-08-31 MED ORDER — DICLOFENAC SODIUM 1 % TD GEL: 2.0000 g | Freq: Four times a day (QID) | TRANSDERMAL | Status: DC

## 2012-08-31 MED ORDER — OXYCODONE HCL 5 MG PO TABS: 5.0000 mg | ORAL_TABLET | Freq: Four times a day (QID) | ORAL | Status: DC | PRN

## 2012-08-31 NOTE — Progress Notes (Signed)
Subjective:    Patient ID: [name], female    DOB: 1960/11/25, 52 y.o.   MRN: [id number]  HPI The patient is a 52 year old female, who presents with LBP, which radiates into her posterior LE in a S1 distribution . 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 The symptoms have not improved after the surgeries, they have gotten worse. The patient complains about moderate to severe pain . Patient also complains about numbness and tingling in the same distribution . Applying heat, taking medications , doing aquatic exercises alleviate the symptoms. Prolonged standing , sitting aggrevates the symptoms. She saw another spine surgeon at [facility] for possible further treatment,but he did not have any of her notes/images etc, when she saw him and they basically did not have a good visit, she wanted to see another surgeon in this area. I referred her to Dr. Noel Gerold, she had an appointment on July the 9th, he ordered a CT-myelogram, which will be done on 07/31. Patient has been approved for social disability.  Otherwise the problem has been stable.  Pain Inventory Average Pain 6 Pain Right Now 6 My pain is constant, sharp, burning, dull, stabbing, tingling and aching  In the last 24 hours, has pain interfered with the following? General activity 8 Relation with others 8 Enjoyment of life 10 What TIME of day is your pain at its worst? constant Sleep (in general) NA  Pain is worse with: walking, bending, sitting and standing Pain improves with: rest and heat/ice Relief from Meds: 5  Mobility walk without assistance how many minutes can you walk? 30-45 ability to climb steps?  yes do you drive?  no Do you have any goals in this area?  yes  Function disabled: date disabled 01-2012 I need assistance with the following:  meal prep, household duties and shopping Do you have any goals in this area?   yes  Neuro/Psych numbness tingling spasms depression  Prior Studies Any changes since last visit?  no  Physicians involved in your care Any changes since last visit?  no   Family History  Problem Relation Age of Onset  . Cancer Mother    History   Social History  . Marital Status: Married    Spouse Name: N/A    Number of Children: N/A  . Years of Education: N/A   Social History Main Topics  . Smoking status: Never Smoker   . Smokeless tobacco: Never Used  . Alcohol Use: No  . Drug Use: No  . Sexually Active: None   Other Topics Concern  . None   Social History Narrative  . None   Past Surgical History  Procedure Laterality Date  . Tonsilectomy, adenoidectomy, bilateral myringotomy and tubes  1980  . Nasal sinus surgery  2002  . Abdominal hysterectomy  2007  . Cholecystectomy  2009  . Carpal tunnel release  2010    LEFT HANDED SURGERY  . Lumbar fusion  2011  . Lumbar repair  2011  . Lumbar repair  2000   Past Medical History  Diagnosis Date  . GERD (gastroesophageal reflux disease)   . Lumbago    BP 142/72  Pulse 118  Resp 16  Ht 5\' 1"  (1.549 m)  Wt 145 lb (65.772 kg)  BMI 27.41 kg/m2  SpO2 98%     Review of Systems  Gastrointestinal: Positive for constipation.  Neurological: Positive for numbness.  Spasms, tingling   Psychiatric/Behavioral: Positive for dysphoric mood.  All other systems reviewed and are negative.       Objective:   Physical Exam Constitutional: She is oriented to person, place, and time. She appears well-developed.  HENT:  Head: Normocephalic.  Eyes: Pupils are equal, round, and reactive to light.  Neck: Normal range of motion.  Neurological: She is alert and oriented to person, place, and time.  Skin: Skin is warm and dry.  Psychiatric: She has a normal mood and affect.  Symmetric normal motor tone is noted throughout. Normal muscle bulk. Muscle testing reveals 5/5 muscle strength of the upper extremity,  and 5/5 of the lower extremity, except left hip flexion 4/5, mainly because of pain, and left gastrocnemius 4/5. Full range of motion in upper and lower extremities. ROM of spine is restricted.  DTR in the upper and lower extremity are present and symmetric 2+, except left achilles tendon reflex was demineshed 1+. No clonus is noted.  Patient arises from chair without difficulty. Narrow based gait with normal arm swing bilateral.         Assessment & Plan:  Lumbar post laminectomy syndrome.  Chronic left lower extremity radicular pain.  MRI of her lumbar spine, showed that bone graft material is pinching on the left S1 nerve. TF-ESI gave her 40 percent relief at first, but does not give her relief anymore. Referred her to another spine surgeon for possible further treatment, she signed a release form for her notes, and also asked me to talk to the surgeon about her case, which I did. She had an appointment with Dr. Noel Gerold on 08/11/12, he ordered a CT-Myelogram, which will be done on 07/31.  Continue medication , also continue walking program. Advised patient to stay as active as tolerated.  Refilled her Oxycodone 5mg , # 120 Prescribed Voltaren gel, patient can not tolerate NSAIDs  Follow up in one month.

## 2012-08-31 NOTE — Patient Instructions (Signed)
Stay as active as tolerated. 

## 2012-09-02 ENCOUNTER — Ambulatory Visit
Admission: RE | Admit: 2012-09-02 | Discharge: 2012-09-02 | Disposition: A | Discharge status: Home / Self Care | Payer: BC Managed Care – PPO | Source: Ambulatory Visit | Attending: Orthopaedic Surgery | Admitting: Orthopaedic Surgery

## 2012-09-02 ENCOUNTER — Other Ambulatory Visit: Payer: Self-pay | Admitting: Orthopaedic Surgery

## 2012-09-02 ENCOUNTER — Ambulatory Visit
Admission: RE | Admit: 2012-09-02 | Discharge: 2012-09-02 | Disposition: A | Discharge status: Home / Self Care | Payer: Self-pay | Source: Ambulatory Visit | Attending: Orthopaedic Surgery | Admitting: Orthopaedic Surgery

## 2012-09-02 VITALS — BP 118/63 | HR 87

## 2012-09-02 DIAGNOSIS — M5416 Radiculopathy, lumbar region: Secondary | ICD-10-CM

## 2012-09-02 DIAGNOSIS — M549 Dorsalgia, unspecified: Secondary | ICD-10-CM

## 2012-09-02 DIAGNOSIS — M961 Postlaminectomy syndrome, not elsewhere classified: Secondary | ICD-10-CM

## 2012-09-02 DIAGNOSIS — M545 Low back pain: Secondary | ICD-10-CM

## 2012-09-02 MED ORDER — DIAZEPAM 5 MG PO TABS
10.0000 mg | ORAL_TABLET | Freq: Once | ORAL | Status: AC
  Administered 2012-09-02: 10 mg via ORAL

## 2012-09-02 MED ORDER — OXYCODONE-ACETAMINOPHEN 5-325 MG PO TABS
2.0000 | ORAL_TABLET | Freq: Once | ORAL | Status: AC
  Administered 2012-09-02: 2 via ORAL

## 2012-09-02 MED ORDER — IOHEXOL 180 MG/ML  SOLN
20.0000 mL | Freq: Once | INTRAMUSCULAR | Status: AC | PRN
  Administered 2012-09-02: 20 mL via INTRATHECAL

## 2012-09-02 MED ORDER — DIPHENHYDRAMINE HCL 25 MG PO CAPS
25.0000 mg | ORAL_CAPSULE | Freq: Once | ORAL | Status: AC
  Administered 2012-09-02: 25 mg via ORAL

## 2012-09-02 NOTE — Progress Notes (Signed)
Pt up to bathroom and c/o back pain and asked for pain meds. meds were given.

## 2012-09-02 NOTE — Progress Notes (Signed)
Patient states she took one Benadryl 25mg  PO at home for sensitivity to IV Contrast (itching, facial reddness).  Gave her another Benadryl 25mg  PO.  jkl

## 2012-09-22 ENCOUNTER — Telehealth: Payer: Self-pay

## 2012-09-22 NOTE — Telephone Encounter (Signed)
Patient is having surgery 09/27/2012.  She was concerned about medication.  She was advised that the provider doing surgery can prescribe until she is released from care then she can follow up with our office.  Patient understands.

## 2012-09-27 ENCOUNTER — Other Ambulatory Visit: Payer: Self-pay

## 2012-09-27 MED ORDER — GABAPENTIN 300 MG PO CAPS: 600.0000 mg | ORAL_CAPSULE | Freq: Three times a day (TID) | ORAL | Status: AC

## 2012-09-29 ENCOUNTER — Ambulatory Visit: Payer: BC Managed Care – PPO | Admitting: Physical Medicine and Rehabilitation

## 2012-10-14 ENCOUNTER — Encounter: Payer: Self-pay | Admitting: Physical Medicine and Rehabilitation

## 2012-10-14 ENCOUNTER — Encounter
Payer: BC Managed Care – PPO | Attending: Physical Medicine and Rehabilitation | Admitting: Physical Medicine and Rehabilitation

## 2012-10-14 VITALS — BP 150/85 | HR 114 | Resp 14 | Ht 61.0 in | Wt 144.0 lb

## 2012-10-14 DIAGNOSIS — M961 Postlaminectomy syndrome, not elsewhere classified: Secondary | ICD-10-CM

## 2012-10-14 MED ORDER — OXYCODONE-ACETAMINOPHEN 10-325 MG PO TABS: 1.0000 | ORAL_TABLET | ORAL | Status: DC | PRN

## 2012-10-14 NOTE — Progress Notes (Signed)
Subjective:    Patient ID: Festus Holts, female    DOB: [birth year], 52 y.o.   MRN: [id number]  HPI The patient is a 52 year old female, who presents with LBP, which radiates into her posterior LE in a S1 distribution . 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 The symptoms have not improved after the surgeries, they have gotten worse. The patient complains about moderate to severe pain . Patient also complains about numbness and tingling in the same distribution . Applying heat, taking medications , doing aquatic exercises alleviate the symptoms. Prolonged standing , sitting aggrevates the symptoms. She saw another spine surgeon at [facility] for possible further treatment,but he did not have any of her notes/images etc, when she saw him and they basically did not have a good visit, she wanted to see another surgeon in this area. I referred her to Dr. Noel Gerold, she had an appointment on July the 9th, he did surgery on her back on 09/27/12, she is doing considerably well.   Pain Inventory Average Pain 7 Pain Right Now 7 My pain is constant, sharp, burning, stabbing, tingling and aching  In the last 24 hours, has pain interfered with the following? General activity 7 Relation with others 7 Enjoyment of life 7 What TIME of day is your pain at its worst? constant Sleep (in general) Fair  Pain is worse with: walking, bending, sitting, standing and some activites Pain improves with: rest, heat/ice and medication Relief from Meds: 5  Mobility walk with assistance use a walker how many minutes can you walk? 30 ability to climb steps?  yes do you drive?  no Do you have any goals in this area?  yes  Function disabled: date disabled 2013 I need assistance with the following:  bathing, meal prep, household duties and shopping Do you have any goals in this area?  yes  Neuro/Psych weakness tingling spasms  Prior Studies Any changes  since last visit?  no  Physicians involved in your care Dr Noel Gerold, Dr Candis Musa   Family History  Problem Relation Age of Onset  . Cancer Mother    History   Social History  . Marital Status: Married    Spouse Name: N/A    Number of Children: N/A  . Years of Education: N/A   Social History Main Topics  . Smoking status: Never Smoker   . Smokeless tobacco: Never Used  . Alcohol Use: No  . Drug Use: No  . Sexual Activity: None   Other Topics Concern  . None   Social History Narrative  . None   Past Surgical History  Procedure Laterality Date  . Tonsilectomy, adenoidectomy, bilateral myringotomy and tubes  1980  . Nasal sinus surgery  2002  . Abdominal hysterectomy  2007  . Cholecystectomy  2009  . Carpal tunnel release  2010    LEFT HANDED SURGERY  . Lumbar fusion  2011  . Lumbar repair  2011  . Lumbar repair  2000   Past Medical History  Diagnosis Date  . GERD (gastroesophageal reflux disease)   . Lumbago    BP 150/85  Pulse 114  Resp 14  Ht 5\' 1"  (1.549 m)  Wt 144 lb (65.318 kg)  BMI 27.22 kg/m2  SpO2 98%     Review of Systems  Gastrointestinal: Positive for nausea and constipation.  Musculoskeletal: Positive for back pain.  Neurological: Positive for weakness.  All other  systems reviewed and are negative.       Objective:   Physical Exam Constitutional: She is oriented to person, place, and time. She appears well-developed. Wearing a lumbar brace HENT:  Head: Normocephalic.  Eyes: Pupils are equal, round, and reactive to light.  Neck: Normal range of motion.  Neurological: She is alert and oriented to person, place, and time.  Skin: Skin is warm and dry.  Psychiatric: She has a normal mood and affect.  Symmetric normal motor tone is noted throughout. Normal muscle bulk. Muscle testing reveals 5/5 muscle strength of the upper extremity, and 5/5 of the lower extremity, except left hip flexion 4/5, mainly because of pain, and left gastrocnemius  4/5. Full range of motion in upper and lower extremities. ROM of spine not tested.    Patient arises from chair with difficulty. Narrow based gait with a walker.         Assessment & Plan:  Lumbar post laminectomy syndrome. Lumbar Surgery on 09/27/12, removal of hardware, decompression of S1 nerve, by Dr. Noel Gerold Chronic left lower extremity radicular pain, has improved immensely, patient is only taking gabapentin 300mg  tid, before she was taking 600mg  tid.  MRI of her lumbar spine, showed that bone graft material is pinching on the left S1 nerve. TF-ESI gave her 40 percent relief at first, but does not give her relief anymore. Referred her to  Dr. Noel Gerold. Continue medication , also continue walking program. Advised patient to stay as active as tolerated. Refilled her Oxycodone, she was getting Oxycodone 10mg  , 1-2 tablets qid, she took 2 tablets qid, and is almost out off her meds, she still has 14 tablets left. I gave her a new Rx for Oxycodone 10mg  q 4hrs, # 180. She can take 7 tablets for the next 14 days if needed, then she should take 6 tablets at the most, as needed for pain. Then we will wean down further.   Follow up in one month.

## 2012-10-14 NOTE — Patient Instructions (Signed)
Follow the instruction of your spine surgeon

## 2012-10-28 ENCOUNTER — Ambulatory Visit: Payer: BC Managed Care – PPO | Admitting: Physical Medicine and Rehabilitation

## 2012-11-09 ENCOUNTER — Encounter: Payer: Self-pay | Admitting: Physical Medicine and Rehabilitation

## 2012-11-09 ENCOUNTER — Encounter
Payer: BC Managed Care – PPO | Attending: Physical Medicine and Rehabilitation | Admitting: Physical Medicine and Rehabilitation

## 2012-11-09 VITALS — BP 130/75 | HR 91 | Resp 14 | Ht 61.0 in | Wt 144.2 lb

## 2012-11-09 DIAGNOSIS — M961 Postlaminectomy syndrome, not elsewhere classified: Secondary | ICD-10-CM | POA: Insufficient documentation

## 2012-11-09 DIAGNOSIS — Z79899 Other long term (current) drug therapy: Secondary | ICD-10-CM | POA: Insufficient documentation

## 2012-11-09 MED ORDER — OXYCODONE-ACETAMINOPHEN 10-325 MG PO TABS: 1.0000 | ORAL_TABLET | ORAL | Status: DC | PRN

## 2012-11-09 NOTE — Patient Instructions (Signed)
You can apply ice or Arnica cream to your right hip

## 2012-11-09 NOTE — Progress Notes (Signed)
Subjective:    Patient ID: [name], female    DOB: October 01, 1960, 53 y.o.   MRN: [id number]  HPI The patient is a 52 year old female, who presents with LBP, which radiates into her posterior LE in a S1 distribution . 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 The symptoms have not improved after the surgeries, they have gotten worse. The patient complains about moderate to severe pain . Patient also complains about numbness and tingling in the same distribution . Applying heat, taking medications , doing aquatic exercises alleviate the symptoms. Prolonged standing , sitting aggrevates the symptoms. She saw another spine surgeon at [facility] for possible further treatment,but he did not have any of her notes/images etc, when she saw him and they basically did not have a good visit, she wanted to see another surgeon in this area. I referred her to Dr. Noel Gerold, she had an appointment on July the 9th, he did surgery on her back on 09/27/12, she is doing considerably well.  Today she complains about some right hip pain.  Pain Inventory Average Pain 5 Pain Right Now 5 My pain is constant, dull, tingling and aching  In the last 24 hours, has pain interfered with the following? General activity 7 Relation with others 7 Enjoyment of life 7 What TIME of day is your pain at its worst? all Sleep (in general) Good  Pain is worse with: walking, bending, sitting and standing Pain improves with: rest, heat/ice and medication Relief from Meds: 5  Mobility walk without assistance how many minutes can you walk? 15 do you drive?  no  Function disabled: date disabled . I need assistance with the following:  meal prep, household duties and shopping  Neuro/Psych weakness numbness tingling spasms depression  Prior Studies Any changes since last visit?  no  Physicians involved in your care Any changes since last visit?  no   Family History   Problem Relation Age of Onset  . Cancer Mother    History   Social History  . Marital Status: Married    Spouse Name: N/A    Number of Children: N/A  . Years of Education: N/A   Social History Main Topics  . Smoking status: Never Smoker   . Smokeless tobacco: Never Used  . Alcohol Use: No  . Drug Use: No  . Sexual Activity: None   Other Topics Concern  . None   Social History Narrative  . None   Past Surgical History  Procedure Laterality Date  . Tonsilectomy, adenoidectomy, bilateral myringotomy and tubes  1980  . Nasal sinus surgery  2002  . Abdominal hysterectomy  2007  . Cholecystectomy  2009  . Carpal tunnel release  2010    LEFT HANDED SURGERY  . Lumbar fusion  2011  . Lumbar repair  2011  . Lumbar repair  2000   Past Medical History  Diagnosis Date  . GERD (gastroesophageal reflux disease)   . Lumbago    BP 130/75  Pulse 91  Resp 14  Ht 5\' 1"  (1.549 m)  Wt 144 lb 3.2 oz (65.409 kg)  BMI 27.26 kg/m2  SpO2 96%     Review of Systems  Gastrointestinal: Positive for constipation.  Musculoskeletal: Positive for back pain.       Spasms  Neurological: Positive for weakness.       Tingling  Psychiatric/Behavioral: Positive for dysphoric mood.  All other systems  reviewed and are negative.       Objective:   Physical Exam Constitutional: She is oriented to person, place, and time. She appears well-developed. Wearing a lumbar brace  HENT:  Head: Normocephalic.  Eyes: Pupils are equal, round, and reactive to light.  Neck: Normal range of motion.  Neurological: She is alert and oriented to person, place, and time.  Skin: Skin is warm and dry.  Psychiatric: She has a normal mood and affect.  Symmetric normal motor tone is noted throughout. Normal muscle bulk. Muscle testing reveals 5/5 muscle strength of the upper extremity, and 5/5 of the lower extremity, except left hip flexion 4/5, mainly because of pain, and left gastrocnemius 4/5. Full range of  motion in upper and lower extremities. ROM of spine not tested.  Patient arises from chair with difficulty. Narrow based gait with a walker.         Assessment & Plan:  Lumbar post laminectomy syndrome. Lumbar Surgery on 09/27/12, removal of hardware, decompression of S1 nerve, by Dr. Noel Gerold  Chronic left lower extremity radicular pain, has improved immensely, patient is only taking gabapentin 300mg  tid, before she was taking 600mg  tid.  MRI of her lumbar spine, showed that bone graft material is pinching on the left S1 nerve. TF-ESI gave her 40 percent relief at first, but does not give her relief anymore. Referred her to Dr. Noel Gerold.  Continue medication , also continue walking program. Advised patient to stay as active as tolerated.  I gave her a new Rx for Oxycodone 10mg  q 4hrs, # 150, she can take 4-5 tablets a day, then we will wean down further. Advised patient to apply ice on her hip, also she could apply Arnica cream, if the hip pain does not resolve, consider injection at next visit Follow up in 1 month

## 2012-12-08 ENCOUNTER — Encounter: Payer: Self-pay | Admitting: Physical Medicine and Rehabilitation

## 2012-12-08 ENCOUNTER — Encounter
Payer: BC Managed Care – PPO | Attending: Physical Medicine and Rehabilitation | Admitting: Physical Medicine and Rehabilitation

## 2012-12-08 VITALS — BP 147/82 | HR 80 | Resp 14 | Ht 61.0 in | Wt 147.0 lb

## 2012-12-08 DIAGNOSIS — Z79899 Other long term (current) drug therapy: Secondary | ICD-10-CM | POA: Insufficient documentation

## 2012-12-08 DIAGNOSIS — M961 Postlaminectomy syndrome, not elsewhere classified: Secondary | ICD-10-CM | POA: Insufficient documentation

## 2012-12-08 MED ORDER — OXYCODONE-ACETAMINOPHEN 10-325 MG PO TABS: 1.0000 | ORAL_TABLET | ORAL | Status: DC | PRN

## 2012-12-08 NOTE — Progress Notes (Signed)
Subjective:    Patient ID: [name], female    DOB: 05-04-1960, 52 y.o.   MRN: [id number]  HPI The patient is a 52 year old female, who presents with LBP, which radiates into her posterior LE in a S1 distribution . 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 The symptoms had not improved after the surgeries, they had gotten worse. The patient complains about moderate to severe pain . Patient also complained about numbness and tingling in the same distribution . Applying heat, taking medications ,  alleviate the symptoms. Prolonged standing , sitting aggrevates the symptoms. She saw another spine surgeon at [facility] for possible further treatment,but he did not have any of her notes/images etc, when she saw him and they basically did not have a good visit, she wanted to see another surgeon in this area. I referred her to Dr. Noel Gerold, she had an appointment on July the 9th, he did surgery on her back on 09/27/12, she is doing considerably well.  The right hip pain has resolved with the treatment I recommended at her last visit..  Pain Inventory Average Pain 5 Pain Right Now 5 My pain is constant and aching  In the last 24 hours, has pain interfered with the following? General activity 7 Relation with others 6 Enjoyment of life 6 What TIME of day is your pain at its worst? constant Sleep (in general) Fair  Pain is worse with: bending, sitting, standing and some activites Pain improves with: rest and medication Relief from Meds: 5  Mobility walk without assistance how many minutes can you walk? 30 ability to climb steps?  yes Do you have any goals in this area?  yes  Function disabled: date disabled 12/13 I need assistance with the following:  meal prep, household duties and shopping Do you have any goals in this area?  yes  Neuro/Psych numbness spasms  Prior Studies Any changes since last visit?  no  Physicians involved  in your care Any changes since last visit?  no   Family History  Problem Relation Age of Onset  . Cancer Mother    History   Social History  . Marital Status: Married    Spouse Name: N/A    Number of Children: N/A  . Years of Education: N/A   Social History Main Topics  . Smoking status: Never Smoker   . Smokeless tobacco: Never Used  . Alcohol Use: No  . Drug Use: No  . Sexual Activity: None   Other Topics Concern  . None   Social History Narrative  . None   Past Surgical History  Procedure Laterality Date  . Tonsilectomy, adenoidectomy, bilateral myringotomy and tubes  1980  . Nasal sinus surgery  2002  . Abdominal hysterectomy  2007  . Cholecystectomy  2009  . Carpal tunnel release  2010    LEFT HANDED SURGERY  . Lumbar fusion  2011  . Lumbar repair  2011  . Lumbar repair  2000   Past Medical History  Diagnosis Date  . GERD (gastroesophageal reflux disease)   . Lumbago    BP 147/82  Pulse 80  Resp 14  Ht 5\' 1"  (1.549 m)  Wt 147 lb (66.679 kg)  BMI 27.79 kg/m2  SpO2 99%     Review of Systems  Gastrointestinal: Positive for constipation.  Neurological: Positive for numbness.  All other systems reviewed and are negative.  Objective:   Physical Exam Constitutional: She is oriented to person, place, and time. She appears well-developed. Wearing a lumbar brace  HENT:  Head: Normocephalic.  Eyes: Pupils are equal, round, and reactive to light.  Neck: Normal range of motion.  Neurological: She is alert and oriented to person, place, and time.  Skin: Skin is warm and dry.  Psychiatric: She has a normal mood and affect.  Symmetric normal motor tone is noted throughout. Normal muscle bulk. Muscle testing reveals 5/5 muscle strength of the upper extremity, and 5/5 of the lower extremity, except left hip flexion 4/5, mainly because of pain, and left gastrocnemius 4/5. Full range of motion in upper and lower extremities. ROM of spine not tested.   Patient arises from chair with difficulty. Walks normal , without a walker, but with L-brace.         Assessment & Plan:  Lumbar post laminectomy syndrome. Lumbar Surgery on 09/27/12, removal of hardware, decompression of S1 nerve, by Dr. Noel Gerold . MRI of her lumbar spine, showed that bone graft material was pinching on the left S1 nerve. TF-ESI gave her 40 percent relief at first, but does not give her relief anymore. Referred her to Dr. Noel Gerold.She will see Dr. Noel Gerold in 1 month, until then she should still wear her lumbar brace.  Chronic left lower extremity radicular pain, has improved immensely, patient is only taking gabapentin 300mg  tid, before she was taking 600mg  tid.    Continue medication , also continue walking program. Advised patient to stay as active as tolerated. I gave her a new Rx for Oxycodone 10mg  q 4hrs, # 150, she can take 4-5 tablets a day, then we will wean down further.  Advised patient to apply ice on her hip, also she could apply Arnica cream, if the hip pain does not resolve, considered injection at this  visit , but pain has resolved with this treatment. Follow up in 1 month

## 2012-12-08 NOTE — Patient Instructions (Signed)
Stay as active as tolerated, but follow your surgeon's instructions

## 2012-12-09 ENCOUNTER — Other Ambulatory Visit: Payer: Self-pay

## 2013-01-03 ENCOUNTER — Other Ambulatory Visit: Payer: Self-pay | Admitting: *Deleted

## 2013-01-03 MED ORDER — OXYCODONE-ACETAMINOPHEN 10-325 MG PO TABS: 1.0000 | ORAL_TABLET | ORAL | Status: DC | PRN

## 2013-01-03 NOTE — Telephone Encounter (Signed)
rx for controlled medication oxycodone 10/325 printed for 12/514 RN visit.

## 2013-01-07 ENCOUNTER — Ambulatory Visit: Payer: BC Managed Care – PPO | Admitting: Physical Medicine and Rehabilitation

## 2013-01-07 ENCOUNTER — Encounter: Payer: BC Managed Care – PPO | Attending: Physical Medicine & Rehabilitation | Admitting: *Deleted

## 2013-01-07 VITALS — BP 153/81 | HR 69 | Resp 14 | Ht 61.0 in | Wt 145.8 lb

## 2013-01-07 DIAGNOSIS — M5416 Radiculopathy, lumbar region: Secondary | ICD-10-CM

## 2013-01-07 DIAGNOSIS — Z79899 Other long term (current) drug therapy: Secondary | ICD-10-CM | POA: Insufficient documentation

## 2013-01-07 DIAGNOSIS — M961 Postlaminectomy syndrome, not elsewhere classified: Secondary | ICD-10-CM

## 2013-01-07 NOTE — Patient Instructions (Signed)
Follow up in one month with RN for pill count and med refill.

## 2013-01-07 NOTE — Progress Notes (Signed)
Here for medication refill  And pill counts.  No changes since last visit.  Only complaint is feeling tired all the time.  Suggested she look into taking a vitamin b complex or b12.  She is thinking about talking to her  PCP about b12 injections.  Pill count fill date 12/10/12 # 150 nand today NV# 13   Appropriate.  Follow up with RN in month .  Refilled Percocet 10-325 # 150 today.

## 2013-01-31 ENCOUNTER — Other Ambulatory Visit: Payer: Self-pay | Admitting: *Deleted

## 2013-01-31 MED ORDER — OXYCODONE-ACETAMINOPHEN 10-325 MG PO TABS: 1.0000 | ORAL_TABLET | ORAL | Status: AC | PRN

## 2013-01-31 NOTE — Telephone Encounter (Signed)
RX printed early for controlled medication for the visit with RN on 02/07/13 (to be signed by MD) 

## 2013-02-07 ENCOUNTER — Encounter: Payer: BC Managed Care – PPO | Attending: Physical Medicine & Rehabilitation | Admitting: *Deleted

## 2013-02-07 DIAGNOSIS — M5416 Radiculopathy, lumbar region: Secondary | ICD-10-CM

## 2013-02-07 DIAGNOSIS — M961 Postlaminectomy syndrome, not elsewhere classified: Secondary | ICD-10-CM | POA: Insufficient documentation

## 2013-02-07 DIAGNOSIS — IMO0002 Reserved for concepts with insufficient information to code with codable children: Secondary | ICD-10-CM | POA: Insufficient documentation

## 2013-02-07 DIAGNOSIS — Z79899 Other long term (current) drug therapy: Secondary | ICD-10-CM | POA: Insufficient documentation

## 2013-02-07 NOTE — Patient Instructions (Signed)
Follow up one month with Dr Letta Pate of [facility]  [place]

## 2013-02-07 NOTE — Progress Notes (Signed)
Here for pill count and medication refills. Fill date 01/09/13 # 150    Today NV# 1.5 tablet.  Pill count is appropriate.  VSS    Pain level: 5  No changes to medication list or pain levels.  Opioid risk score is 1.  Follow up next month with Dr Letta Pate or PA

## 2013-03-09 ENCOUNTER — Encounter: Payer: BC Managed Care – PPO | Attending: Physical Medicine & Rehabilitation | Admitting: *Deleted

## 2013-03-10 ENCOUNTER — Ambulatory Visit: Payer: BC Managed Care – PPO | Admitting: Physical Medicine and Rehabilitation

## 2014-09-28 ENCOUNTER — Other Ambulatory Visit: Payer: Self-pay | Admitting: Internal Medicine

## 2014-09-28 DIAGNOSIS — N951 Menopausal and female climacteric states: Secondary | ICD-10-CM

## 2014-11-02 ENCOUNTER — Ambulatory Visit: Payer: Self-pay | Admitting: Allergy and Immunology

## 2020-04-17 LAB — HEMOGLOBIN A1C
Estimated Avg Glucose, External: 97 mg/dL (ref 91–123)
Hemoglobin A1C, External: 5 % (ref 4.8–5.6)

## 2020-11-30 ENCOUNTER — Encounter

## 2020-11-30 LAB — SENTARA SPECIMEN COLLN.

## 2020-11-30 NOTE — Interval H&P Note (Signed)
Neck 13 in

## 2020-12-02 LAB — CBC
Hematocrit: 29.6 % — ABNORMAL LOW (ref 35.1–48.0)
Hemoglobin: 9.7 g/dL — ABNORMAL LOW (ref 11.7–16.0)
MCH: 31 pg (ref 26–34)
MCHC: 33 g/dL (ref 31–36)
MCV: 94 fL (ref 80–99)
MPV: 9.7 fL (ref 9.0–13.0)
Platelets: 270 10*3/uL (ref 140–440)
RBC: 3.14 M/uL — ABNORMAL LOW (ref 3.80–5.20)
RDW: 15.2 % (ref 10.0–15.5)
WBC: 7.1 10*3/uL (ref 4.0–11.0)

## 2020-12-02 LAB — BASIC METABOLIC PANEL
Anion Gap: 8 mmol/L (ref 3.0–15.0)
BUN: 6 mg/dL (ref 6–22)
CO2: 29 mmol/L (ref 20–32)
Calcium: 8.8 mg/dL (ref 8.4–10.5)
Chloride: 106 mmol/L (ref 98–110)
Creatinine: 1 mg/dL (ref 0.5–1.2)
GLOMERULAR FILTRATION RATE, 11097: >60 mL/min/{1.73_m2} (ref >60.0)
Glucose: 122 mg/dL — ABNORMAL HIGH (ref 70–99)
Potassium: 4.2 mmol/L (ref 3.5–5.5)
Sodium: 143 mmol/L (ref 133–145)

## 2020-12-02 LAB — METABOLIC PANEL, BASIC
Anion gap: 8 mmol/L (ref 3.0–15.0)
BUN: 6 mg/dL (ref 6–22)
CO2: 29 mmol/L (ref 20–32)
Calcium: 8.8 mg/dL (ref 8.4–10.5)
Chloride: 106 mmol/L (ref 98–110)
Creatinine: 1 mg/dL (ref 0.5–1.2)
GLOMERULAR FILTRATION RATE: >60 mL/min/{1.73_m2} (ref >60.0)
Glucose: 122 mg/dL — ABNORMAL HIGH (ref 70–99)
Potassium: 4.2 mmol/L (ref 3.5–5.5)
Sodium: 143 mmol/L (ref 133–145)

## 2020-12-02 LAB — CBC W/O DIFF
HCT: 29.6 % — ABNORMAL LOW (ref 35.1–48.0)
HGB: 9.7 g/dL — ABNORMAL LOW (ref 11.7–16.0)
MCH: 31 pg (ref 26–34)
MCHC: 33 g/dL (ref 31–36)
MCV: 94 fL (ref 80–99)
MPV: 9.7 fL (ref 9.0–13.0)
PLATELET: 270 10*3/uL (ref 140–440)
RBC: 3.14 M/uL — ABNORMAL LOW (ref 3.80–5.20)
RDW: 15.2 % (ref 10.0–15.5)
WBC: 7.1 10*3/uL (ref 4.0–11.0)

## 2020-12-02 LAB — MRSA SCREENING CULTURE

## 2020-12-03 LAB — EKG 12-LEAD
Atrial Rate: 104 {beats}/min
P Axis: 72 degrees
P-R Interval: 146 ms
Q-T Interval: 348 ms
QRS Duration: 72 ms
QTc Calculation (Bazett): 457 ms
R Axis: 72 degrees
T Axis: 53 degrees
Ventricular Rate: 104 {beats}/min

## 2020-12-03 LAB — EKG, 12 LEAD, INITIAL
Atrial Rate: 104 {beats}/min
Calculated P Axis: 72 degrees
Calculated R Axis: 72 degrees
Calculated T Axis: 53 degrees
P-R Interval: 146 ms
Q-T Interval: 348 ms
QRS Duration: 72 ms
QTC Calculation (Bezet): 457 ms
Ventricular Rate: 104 {beats}/min

## 2020-12-06 NOTE — Interval H&P Note (Signed)
No sleep apnea, removable prosthetic devices or family history of malignant hyperthermia. CHG wash kit instructions reviewed. PCP is aware of the surgery. No participation in clinical trial or research study. Do not bring any valuables on DOS- jewelry, wallet, cash, laptop, medications.  Does not meet criteria for special population at this time. Possible time delay day of surgery reviewed. Covid Card-Not vaccinated DNR status-none. Faxed CBC results to Diane at Dr. Lennon Alstrom office

## 2020-12-11 NOTE — Interval H&P Note (Signed)
LM for Jennifer Johnston requesting Hp for Dr Carney Living cases tomorrow.

## 2020-12-11 NOTE — H&P (Signed)
Pre-Admission History and Physical    Patient: Jennifer Johnston   MRN: [id number]   SSN: [id number]   Date of Birth: 03/05/1960   Age: 60 y.o.   Sex: female     Patient scheduled for: placement intrathecal pain pump.  Date of surgery: 12/12/20.  Surgeon: Pamalee Leyden, MD    HPI:  Jennifer Johnston is a 60 y.o. female with progression in pain. She has had previous L5/S1 fusion. MRI demonstrates evidence of arachnoiditis. Severe back, buttock and leg pain. This patient has failed the presurgical conservative treatments  including physical therapy, spinal block injections and medications. Pain has impacted the patient's functional ability. She is being admitted for surgical intervention.         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    Chronic pain 2011    low back    GERD (gastroesophageal reflux disease) 2011    High cholesterol 2017    Hypertension 2021    Nausea & vomiting 2007    postop with hysterectomy    Sickle cell trait (HCC) 1978    never had symptoms    Tachycardia 2002    Tinnitus 09/2020    Right Ear     Social History     Socioeconomic History    Marital status: MARRIED   Tobacco Use    Smoking status: Never     Passive exposure: Past    Smokeless tobacco: Never   Vaping Use    Vaping Use: Never used   Substance and Sexual Activity    Alcohol use: Not Currently     Comment: last drink in 2012    Drug use: Never    Sexual activity: Not Currently     Partners: Male     Birth control/protection: Surgical     Past Surgical History:   Procedure Laterality Date    HX BACK SURGERY  04/2009    to remove bone fragments    HX CESAREAN SECTION  12/2001    HX COLONOSCOPY  2015    every 10 years    HX HEENT  2002    polyps removed from sinuses    HX LUMBAR FUSION  03/2009    L5-S1 with cages, revision in 2 wks later in 2011    HX TAH AND BSO  2007    HX TONSILLECTOMY  1979     No family history on file.  Allergies   Allergen Reactions    Shrimp Anaphylaxis    Iodinated  Contrast Media Rash    Sulfa (Sulfonamide Antibiotics) Hives and Itching     Current Outpatient Medications   Medication Sig Dispense Refill    methocarbamoL (ROBAXIN) 750 mg tablet Take 750 mg by mouth every eight (8) hours as needed for Muscle Spasm(s). Indications: muscle spasm      gabapentin (NEURONTIN) 600 mg tablet Take 600 mg by mouth two (2) times a day. Indications: neuropathic pain      HYDROcodone-acetaminophen (NORCO) 10-325 mg tablet Take 1 Tablet by mouth every six (6) hours as needed for Pain.      omeprazole (PRILOSEC) 40 mg capsule Take 40 mg by mouth daily. Indications: gastroesophageal reflux disease      amLODIPine (NORVASC) 5 mg tablet Take 5 mg by mouth nightly. Indications: high blood pressure      multivit with calcium,iron,min (MULTIPLE VITAMIN, WOMENS PO) Take 1 Tablet by mouth daily. Gummies  Panax ginseng root (GINSENG KOREAN PO) Take 100 mg by mouth daily.      ferrous sulfate 325 mg (65 mg iron) tablet Take 65 mg by mouth Daily (before breakfast).      atorvastatin (LIPITOR) 20 mg tablet Take 20 mg by mouth daily. Indications: high cholesterol         ROS:  Denies chills, fever,night sweats,  bowel or bladder dysfunction, unexplained weight loss/weight gain, chest pain, sob or anxiety.    Physical Examination    Gen: Well developed, well nourished 60 y.o. female with good strength in her ehl, ta, hams and quads. Severe pain in her back, buttock and legs. No clonus, no hoffmans, no babinski. 2+ pulses. Soft abdomen .    Assessment and Plan    Due to the pt's persistent symptoms unrelieved by conservative measure Jennifer Johnston is being admitted to undergo surgical intervention. The post-operative plan of care consists of physical therapy, home health and a 2 week f/u office visit.  The risks, benefits, complications and alternatives to surgery have been discussed in detail with the patient.  The patient understands and agrees to proceed.

## 2020-12-12 ENCOUNTER — Ambulatory Visit: Admit: 2020-12-12 | Payer: MEDICAID | Primary: Family Medicine

## 2020-12-12 ENCOUNTER — Inpatient Hospital Stay: Payer: MEDICAID

## 2020-12-12 MED ORDER — ALBUTEROL SULFATE 0.083 % (0.83 MG/ML) SOLN FOR INHALATION
2.5 mg /3 mL (0.083 %) | Freq: Once | RESPIRATORY_TRACT | Status: DC | PRN
Start: 2020-12-12 — End: 2020-12-12

## 2020-12-12 MED ORDER — METOCLOPRAMIDE 5 MG/ML IJ SOLN
5 mg/mL | INTRAMUSCULAR | Status: DC | PRN
Start: 2020-12-12 — End: 2020-12-12
  Administered 2020-12-12: 16:00:00 via INTRAVENOUS

## 2020-12-12 MED ORDER — HYDROMORPHONE 1 MG/ML INJECTION SOLUTION
1 mg/mL | INTRAMUSCULAR | Status: DC | PRN
Start: 2020-12-12 — End: 2020-12-12

## 2020-12-12 MED ORDER — PHENYLEPHRINE 1 MG/10 ML (100 MCG/ML) IN NS IV SYRINGE
1 mg/0 mL (00 mcg/mL) | INTRAVENOUS | Status: AC
Start: 2020-12-12 — End: ?

## 2020-12-12 MED ORDER — FENTANYL CITRATE (PF) 50 MCG/ML IJ SOLN
50 mcg/mL | INTRAMUSCULAR | Status: DC | PRN
Start: 2020-12-12 — End: 2020-12-12
  Administered 2020-12-12 (×2): via INTRAVENOUS

## 2020-12-12 MED ORDER — DEXAMETHASONE SODIUM PHOSPHATE 4 MG/ML IJ SOLN
4 mg/mL | INTRAMUSCULAR | Status: DC | PRN
Start: 2020-12-12 — End: 2020-12-12
  Administered 2020-12-12: 16:00:00 via INTRAVENOUS

## 2020-12-12 MED ORDER — ONDANSETRON (PF) 4 MG/2 ML INJECTION
4 mg/2 mL | Freq: Once | INTRAMUSCULAR | Status: DC
Start: 2020-12-12 — End: 2020-12-12

## 2020-12-12 MED ORDER — BUPIVACAINE (PF) 0.5 % (5 MG/ML) IJ SOLN
0.5 % (5 mg/mL) | INTRAMUSCULAR | Status: AC
Start: 2020-12-12 — End: ?

## 2020-12-12 MED ORDER — OXYCODONE 5 MG TAB
5 mg | ORAL | Status: AC
Start: 2020-12-12 — End: 2020-12-12
  Administered 2020-12-12: 19:00:00 via ORAL

## 2020-12-12 MED ORDER — DEXAMETHASONE SODIUM PHOSPHATE 4 MG/ML IJ SOLN
4 mg/mL | INTRAMUSCULAR | Status: AC
Start: 2020-12-12 — End: ?

## 2020-12-12 MED ORDER — LACTATED RINGERS IV
INTRAVENOUS | Status: DC
Start: 2020-12-12 — End: 2020-12-12
  Administered 2020-12-12: 14:00:00 via INTRAVENOUS

## 2020-12-12 MED ORDER — ONDANSETRON (PF) 4 MG/2 ML INJECTION
4 mg/2 mL | INTRAMUSCULAR | Status: AC
Start: 2020-12-12 — End: ?

## 2020-12-12 MED ORDER — MORPHINE (PF) 10 MG/ML IJ SOLN
10 mg/mL | INTRAMUSCULAR | Status: DC
Start: 2020-12-12 — End: 2020-12-12
  Administered 2020-12-12: 16:00:00 via INTRATHECAL

## 2020-12-12 MED ORDER — VANCOMYCIN 1,000 MG IV SOLR
1000 mg | INTRAVENOUS | Status: AC
Start: 2020-12-12 — End: ?

## 2020-12-12 MED ORDER — ONDANSETRON (PF) 4 MG/2 ML INJECTION
4 mg/2 mL | INTRAMUSCULAR | Status: DC | PRN
Start: 2020-12-12 — End: 2020-12-12
  Administered 2020-12-12: 16:00:00 via INTRAVENOUS

## 2020-12-12 MED ORDER — NALOXONE 0.4 MG/ML INJECTION
0.4 mg/mL | INTRAMUSCULAR | Status: DC | PRN
Start: 2020-12-12 — End: 2020-12-12

## 2020-12-12 MED ORDER — LACTATED RINGERS IV
INTRAVENOUS | Status: DC
Start: 2020-12-12 — End: 2020-12-12

## 2020-12-12 MED ORDER — FENTANYL CITRATE (PF) 50 MCG/ML IJ SOLN
50 mcg/mL | INTRAMUSCULAR | Status: DC | PRN
Start: 2020-12-12 — End: 2020-12-12
  Administered 2020-12-12 (×4): via INTRAVENOUS

## 2020-12-12 MED ORDER — FENTANYL CITRATE (PF) 50 MCG/ML IJ SOLN
50 mcg/mL | INTRAMUSCULAR | Status: AC
Start: 2020-12-12 — End: ?

## 2020-12-12 MED ORDER — HYDROMORPHONE (PF) 1 MG/ML IJ SOLN
1 mg/mL | INTRAMUSCULAR | Status: AC
Start: 2020-12-12 — End: ?

## 2020-12-12 MED ORDER — METOCLOPRAMIDE 5 MG/ML IJ SOLN
5 mg/mL | INTRAMUSCULAR | Status: AC
Start: 2020-12-12 — End: ?

## 2020-12-12 MED ORDER — NALBUPHINE 10 MG/ML INJECTION
10 mg/mL | INTRAMUSCULAR | Status: DC | PRN
Start: 2020-12-12 — End: 2020-12-12

## 2020-12-12 MED ORDER — CEFAZOLIN 2 GRAM/20 ML IN STERILE WATER INTRAVENOUS SYRINGE
2 gram/0 mL | Freq: Once | INTRAVENOUS | Status: AC
Start: 2020-12-12 — End: 2020-12-12
  Administered 2020-12-12: 16:00:00 via INTRAVENOUS

## 2020-12-12 MED ORDER — VANCOMYCIN 1,000 MG IV SOLR
1000 mg | INTRAVENOUS | Status: DC | PRN
Start: 2020-12-12 — End: 2020-12-12
  Administered 2020-12-12: 16:00:00 via TOPICAL

## 2020-12-12 MED ORDER — SODIUM CHLORIDE 0.9 % IJ SYRG
INTRAMUSCULAR | Status: DC | PRN
Start: 2020-12-12 — End: 2020-12-12

## 2020-12-12 MED ORDER — PHENYLEPHRINE 1 MG/10 ML (100 MCG/ML) IN NS IV SYRINGE
1 mg/0 mL (00 mcg/mL) | INTRAVENOUS | Status: DC | PRN
Start: 2020-12-12 — End: 2020-12-12
  Administered 2020-12-12 (×3): via INTRAVENOUS

## 2020-12-12 MED ORDER — DIPHENHYDRAMINE HCL 50 MG/ML IJ SOLN
50 mg/mL | INTRAMUSCULAR | Status: DC | PRN
Start: 2020-12-12 — End: 2020-12-12

## 2020-12-12 MED ORDER — PROPOFOL 10 MG/ML IV EMUL
10 mg/mL | INTRAVENOUS | Status: DC | PRN
Start: 2020-12-12 — End: 2020-12-12
  Administered 2020-12-12: 16:00:00 via INTRAVENOUS

## 2020-12-12 MED ORDER — BUPIVACAINE (PF) 0.5 % (5 MG/ML) IJ SOLN
0.5 % (5 mg/mL) | INTRAMUSCULAR | Status: DC | PRN
Start: 2020-12-12 — End: 2020-12-12
  Administered 2020-12-12 (×2)

## 2020-12-12 MED ORDER — MIDAZOLAM 1 MG/ML IJ SOLN
1 mg/mL | INTRAMUSCULAR | Status: DC | PRN
Start: 2020-12-12 — End: 2020-12-12
  Administered 2020-12-12: 15:00:00 via INTRAVENOUS

## 2020-12-12 MED ORDER — MIDAZOLAM 1 MG/ML IJ SOLN
1 mg/mL | INTRAMUSCULAR | Status: AC
Start: 2020-12-12 — End: ?

## 2020-12-12 MED ORDER — OXYCODONE 5 MG TAB
5 mg | ORAL_TABLET | Freq: Four times a day (QID) | ORAL | 0 refills | Status: AC | PRN
Start: 2020-12-12 — End: 2020-12-19

## 2020-12-12 MED ORDER — SODIUM CHLORIDE 0.9 % IJ SYRG
Freq: Three times a day (TID) | INTRAMUSCULAR | Status: DC
Start: 2020-12-12 — End: 2020-12-12

## 2020-12-12 MED FILL — CEFAZOLIN 2 GRAM/20 ML IN STERILE WATER INTRAVENOUS SYRINGE: 2 gram/0 mL | INTRAVENOUS | Qty: 20

## 2020-12-12 MED FILL — METOCLOPRAMIDE 5 MG/ML IJ SOLN: 5 mg/mL | INTRAMUSCULAR | Qty: 2

## 2020-12-12 MED FILL — PHENYLEPHRINE 1 MG/10 ML (100 MCG/ML) IN NS IV SYRINGE: 1 mg/0 mL (00 mcg/mL) | INTRAVENOUS | Qty: 10

## 2020-12-12 MED FILL — LACTATED RINGERS IV: INTRAVENOUS | Qty: 1000

## 2020-12-12 MED FILL — HYDROMORPHONE (PF) 1 MG/ML IJ SOLN: 1 mg/mL | INTRAMUSCULAR | Qty: 1

## 2020-12-12 MED FILL — SENSORCAINE-MPF 0.5 % (5 MG/ML) INJECTION SOLUTION: 0.5 % (5 mg/mL) | INTRAMUSCULAR | Qty: 30

## 2020-12-12 MED FILL — OXYCODONE 5 MG TAB: 5 mg | ORAL | Qty: 2

## 2020-12-12 MED FILL — FENTANYL CITRATE (PF) 50 MCG/ML IJ SOLN: 50 mcg/mL | INTRAMUSCULAR | Qty: 2

## 2020-12-12 MED FILL — BD POSIFLUSH NORMAL SALINE 0.9 % INJECTION SYRINGE: INTRAMUSCULAR | Qty: 40

## 2020-12-12 MED FILL — DEXAMETHASONE SODIUM PHOSPHATE 4 MG/ML IJ SOLN: 4 mg/mL | INTRAMUSCULAR | Qty: 1

## 2020-12-12 MED FILL — VANCOMYCIN 1,000 MG IV SOLR: 1000 mg | INTRAVENOUS | Qty: 1000

## 2020-12-12 MED FILL — MIDAZOLAM 1 MG/ML IJ SOLN: 1 mg/mL | INTRAMUSCULAR | Qty: 2

## 2020-12-12 MED FILL — ONDANSETRON (PF) 4 MG/2 ML INJECTION: 4 mg/2 mL | INTRAMUSCULAR | Qty: 2

## 2020-12-12 NOTE — Interval H&P Note (Signed)
Patient assisted to the restroom and back to stretcher without incident.  Call bell within reach and no further needs at this time.

## 2020-12-12 NOTE — Interval H&P Note (Signed)
 TRANSFER - IN REPORT:    Verbal report received from [facility]) on Jennifer Johnston  being received from OR(unit) for routine progression of care      Report consisted of patient's Situation, Background, Assessment and   Recommendations(SBAR).     Information from the following report(s) OR Summary was reviewed with the receiving nurse.    Opportunity for questions and clarification was provided.      Assessment completed upon patient's arrival to unit and care assumed.

## 2020-12-12 NOTE — Interval H&P Note (Signed)
No repeat on H&H at this time per Dr. Burna Cash.

## 2020-12-12 NOTE — Interval H&P Note (Signed)
Anesthesia paged for sign out at this time.  Patient meets criteria for transfer to next phase of recovery.

## 2020-12-12 NOTE — Interval H&P Note (Signed)
 TRANSFER - IN REPORT:    Verbal report received from [place], RN(name) on Jennifer Johnston  being received from PACU(unit) for routine progression of care      Report consisted of patient's Situation, Background, Assessment and   Recommendations(SBAR).     Information from the following report(s) SBAR, Kardex, and MAR was reviewed with the receiving nurse.    Opportunity for questions and clarification was provided.      Assessment completed upon patient's arrival to unit and care assumed.

## 2020-12-12 NOTE — Anesthesia Post-Procedure Evaluation (Signed)
 Formatting of this note is different from the original.  Post-Anesthesia Evaluation and Assessment    Cardiovascular Function/Vital Signs  Visit Vitals  BP 124/69   Pulse 95   Temp 36.9 C (98.4 F)   Resp 13   Ht 5' 1 (1.549 m)   Wt 60.1 kg (132 lb 8 oz)   SpO2 100%   BMI 25.04 kg/m     Patient is status post Procedure(s):  PLACEMENT INTRATHECAL PUMP PLACEMENT WITH C-ARM.    Nausea/Vomiting: Controlled.    Postoperative hydration reviewed and adequate.    Pain:  Pain Scale 1: FLACC (12/12/20 1225)  Pain Intensity 1: 4 (12/12/20 1225)   Managed.    Neurological Status:   Neuro (WDL): Exceptions to WDL (12/12/20 1225)   At baseline.    Mental Status and Level of Consciousness: Baseline and appropriate for discharge.    Pulmonary Status:   O2 Device: None (Room air) (12/12/20 1225)   Adequate oxygenation and airway patent.    Complications related to anesthesia: None    Post-anesthesia assessment completed. No concerns.    Patient has met all discharge requirements.    Signed By: Selinda Spear, MD    December 12, 2020     Electronically signed by Spear Selinda, MD at 12/12/2020 12:37 PM EST

## 2020-12-12 NOTE — Op Note (Signed)
[facility]  OPERATIVE REPORT    Name:  CHARLETTA, VOIGHT  MR#:   [id number]  DOB:  1960/05/25  ACCOUNT #:  [id number]  DATE OF SERVICE:  12/12/2020    PREOPERATIVE DIAGNOSIS:  Chronic pain.    POSTOPERATIVE DIAGNOSIS:  Chronic pain.    PROCEDURE PERFORMED:  Insertion of intrathecal pump [employer]tron[employer] type.    SURGEON:  Pamalee Leyden, MD    ASSISTANT:  0    ANESTHESIA:  gea.    COMPLICATIONS:  None.    SPECIMENS REMOVED:  None.    IMPLANTS:  medtronics.    ESTIMATED BLOOD LOSS:  Minimal.    FINDINGS:  Pump was inserted without issue.  Tip was at T9-T10.    OPERATION:  Following induction of endotracheal anesthesia, the patient was turned to lateral decubitus position left side up.  The patient was prepped and draped in usual fashion.  A pocket was made in the left upper quadrant of the abdomen consistent with the patient's preoperative desires centered over the centering Izela Altier provided in consultation with the patient just below the costal margin.  Strict hemostasis was maintained.  The patient is small in stature.  The pocket was a little snug.  The incision was made in a paramedian position on the left down to the lumbodorsal fascia.  Strict hemostasis maintained.  A spinal needle was advanced under the fluoroscopic guidance with an entry point at approximately L2.  Brisk CSF was obtained.  The catheter was advanced under fluoroscopic guidance, T9-T10.  A pursestring suture was placed around the entry site of the catheter imbricating the soft tissues around the needle.  The needle was then removed with the guidewire.  Locking tie-down device was then inserted over the catheter and secured the catheter to the lumbodorsal fascia with multiple nonresorbable sutures.  It was quite secure.  Pocket remained dry.  The tunneling device was used to tunnel the catheter to the pocket.  The splice connector was added and clipped and confirmed placement with brisk CSF dripping from the hub.  The pump had been filled  with narcotic solution as per Dr. Delorise Jackson of pain management's direction and the hub was attached to the pump with the click twist confirmation.  Preset tie-down sutures of Ethibond was secured to the pump.  An antibiotic impregnated pocket was placed around the pump with excess cabling placed behind the pump within the pocket.  The nonresorbable sutures secured the pump down to prevent flipping.  No catheter was cut.  Vancomycin powder instilled in both incisions.  The subcutaneous tissues closed with 2-0 Vicryl, skin with a 3-0 Monocryl subcuticular suture, and Dermabond.  A sterile occlusive dressing placed upon the wound.  All counts were correct.    Pump started with program as per Dr. Delorise Jackson.  The patient went to recovery in good and stable condition.        Pamalee Leyden, MD      [id number]  D:  12/12/2020 11:49  T:  12/12/2020 14:47  JOB #:  [id number]

## 2020-12-12 NOTE — Interval H&P Note (Signed)
Dr. Burna Cash paged for pain meds

## 2020-12-12 NOTE — Interval H&P Note (Signed)
Spoke with Dr. Burna Cash regarding pain level and need for pain meds.  Roxicodone 10 mg po x 1 received

## 2020-12-12 NOTE — Interval H&P Note (Signed)
Dr. Royston Bake paged for pain medication

## 2020-12-12 NOTE — Interval H&P Note (Signed)
 TRANSFER - OUT REPORT:    Verbal report given to SHAUN RN(name) on Jennifer Johnston  being transferred to OR(unit) for routine progression of care       Report consisted of patient's Situation, Background, Assessment and   Recommendations(SBAR).     Information from the following report(s) SBAR was reviewed with the receiving nurse.    Lines:   Peripheral IV 12/12/20 Left;Posterior Hand (Active)   Site Assessment Clean, dry, & intact 12/12/20 1225   Phlebitis Assessment 0 12/12/20 1225   Infiltration Assessment 0 12/12/20 1225   Dressing Status Clean, dry, & intact;New 12/12/20 1225   Dressing Type Tape;Transparent 12/12/20 1225   Hub Color/Line Status Pink;Patent;Infusing 12/12/20 1225        Opportunity for questions and clarification was provided.      Patient transported with:   [employer]

## 2020-12-12 NOTE — Interval H&P Note (Signed)
States minimal relief from pain meds - states " I'm sleepy - just want to go home "-Discharge instructions reviewed with patient and family.  Opportunity for questions and answers given.  No further questions at this time.   Tolerating po fluids and crackers - will plan for discharge

## 2020-12-12 NOTE — Anesthesia Pre-Procedure Evaluation (Signed)
 Formatting of this note is different from the original.  Relevant Problems   No relevant active problems     Anesthetic History     PONV       Review of Systems / Medical History  Patient summary reviewed, nursing notes reviewed and pertinent labs reviewed    Pulmonary  Within defined limits    Pertinent negatives: No COPD, asthma and sleep apnea     Neuro/Psych   Within defined limits    Pertinent negatives: No seizures and CVA   Cardiovascular    Hypertension    Pertinent negatives: No past MI and CHF      GI/Hepatic/Renal    GERD    Pertinent negatives: No liver disease and renal disease   Endo/Other    Arthritis  Pertinent negatives: No diabetes   Other Findings       Physical Exam    Airway  Mallampati: II  TM Distance: 4 - 6 cm  Neck ROM: normal range of motion   Mouth opening: Normal     Cardiovascular     Dental    Dentition: Poor dentition    Pulmonary     Abdominal  GI exam deferred     Other Findings       Anesthetic Plan    ASA: 2  Anesthesia type: general    Induction: Intravenous  Anesthetic plan and risks discussed with: Patient        Electronically signed by Unk Mayo, MD at 12/12/2020  9:39 AM EST

## 2020-12-12 NOTE — Op Note (Signed)
Brief Postoperative Note    Patient: Jennifer Johnston  Date of Birth: Jul 04, 1960  MRN: [id number]    Date of Procedure: 12/12/2020     Pre-Op Diagnosis: CHRONIC PAIN    Post-Op Diagnosis: Same as preoperative diagnosis.      Procedure(s):  PLACEMENT INTRATHECAL PUMP PLACEMENT WITH C-ARM    Surgeon(s):  Pamalee Leyden, MD    Surgical Assistant: Surg Asst-1: Julieta Bellini    Anesthesia: General     Estimated Blood Loss (mL): Minimal    Complications: None    Specimens: * No specimens in log *     Implants:   Implant Name Type Inv. Item Serial No. Manufacturer Lot No. LRB No. Used Action   CATH SPNE INTRATHECAL ASCENDA --  - [id number]  CATH SPNE INTRATHECAL ASCENDA --   [employer]_WD [id number] Left 1 Implanted   PUMP INFUS DRG SYNCRMED --  - [id number]  PUMP INFUS DRG SYNCRMED --  [id number] [employer] NEUR[employer] [facility]_WD  Left 1 Implanted   ENVELOPE PLSE GENRTR L [id number] NEURO ANTIBACT ABSRB TYRX - [id number]  ENVELOPE PLSE GENRTR L [id number] NEURO ANTIBACT ABSRB TYRX  [employer] [place] [employer]_WD [id number] Left 1 Implanted       Drains: * No LDAs found *    Findings: tip at t9/10    Electronically Signed by Pamalee Leyden, MD on 12/12/2020 at 11:34 AM

## 2020-12-12 NOTE — Anesthesia Post-Procedure Evaluation (Signed)
Post-Anesthesia Evaluation and Assessment    Cardiovascular Function/Vital Signs  Visit Vitals  BP 124/69   Pulse 95   Temp 36.9 ??C (98.4 ??F)   Resp 13   Ht '5\' 1"'  (1.549 m)   Wt 60.1 kg (132 lb 8 oz)   SpO2 100%   BMI 25.04 kg/m??       Patient is status post Procedure(s):  PLACEMENT INTRATHECAL PUMP PLACEMENT WITH C-ARM.    Nausea/Vomiting: Controlled.    Postoperative hydration reviewed and adequate.    Pain:  Pain Scale 1: FLACC (12/12/20 1225)  Pain Intensity 1: 4 (12/12/20 1225)   Managed.    Neurological Status:   Neuro (WDL): Exceptions to WDL (12/12/20 1225)   At baseline.    Mental Status and Level of Consciousness: Baseline and appropriate for discharge.    Pulmonary Status:   O2 Device: None (Room air) (12/12/20 1225)   Adequate oxygenation and airway patent.    Complications related to anesthesia: None    Post-anesthesia assessment completed. No concerns.    Patient has met all discharge requirements.    Signed By: Quillian Quince, MD    December 12, 2020

## 2020-12-12 NOTE — Interval H&P Note (Signed)
Reviewed PTA medication list with patient/caregiver and patient/caregiver denies any additional medications.     Patient admits to having a responsible adult care for them at home for at least 24 hours after surgery.    Patient encouraged to use gown warming system and informed that using said warming gown to regulate body temperature prior to a procedure has been shown to help reduce the risks of blood clots and infection.    Patient's pharmacy of choice verified and documented in PTA medication section.    Dual skin assessment & fall risk band verification completed with Jeanette S RN.

## 2020-12-12 NOTE — Interval H&P Note (Signed)
Patient arrived via stretcher, states she needs to use restroom, assisted to bathroom, able to void. Dressings both have pin dot size blood noted. Patient reports pain at a 7/10, caregiver called to sit with patient. Patient has PO fluids and crackers

## 2020-12-12 NOTE — Anesthesia Pre-Procedure Evaluation (Signed)
Relevant Problems   No relevant active problems       Anesthetic History     PONV          Review of Systems / Medical History  Patient summary reviewed, nursing notes reviewed and pertinent labs reviewed    Pulmonary  Within defined limits            Pertinent negatives: No COPD, asthma and sleep apnea     Neuro/Psych   Within defined limits        Pertinent negatives: No seizures and CVA   Cardiovascular    Hypertension            Pertinent negatives: No past MI and CHF       GI/Hepatic/Renal     GERD        Pertinent negatives: No liver disease and renal disease   Endo/Other        Arthritis  Pertinent negatives: No diabetes   Other Findings              Physical Exam    Airway  Mallampati: II  TM Distance: 4 - 6 cm  Neck ROM: normal range of motion   Mouth opening: Normal     Cardiovascular               Dental    Dentition: Poor dentition     Pulmonary                 Abdominal  GI exam deferred       Other Findings            Anesthetic Plan    ASA: 2  Anesthesia type: general          Induction: Intravenous  Anesthetic plan and risks discussed with: Patient

## 2020-12-12 NOTE — Interval H&P Note (Signed)
Medicated with Roxicodone 10 mg po x 1 as ordered

## 2021-07-06 LAB — HEMOGLOBIN A1C
Estimated Avg Glucose, External: 109 mg/dL (ref 91–123)
Hemoglobin A1C, External: 5.4 % (ref 4.8–5.6)

## 2021-11-20 LAB — HEMOGLOBIN A1C
Estimated Avg Glucose, External: 99 mg/dL (ref 91–123)
Hemoglobin A1C, External: 5.1 % (ref 4.8–5.6)

## 2023-08-24 ENCOUNTER — Encounter

## 2023-08-24 ENCOUNTER — Inpatient Hospital Stay: Admit: 2023-08-24 | Payer: Medicaid (Managed Care) | Primary: Family Medicine

## 2023-08-24 DIAGNOSIS — Z451 Encounter for adjustment and management of infusion pump: Principal | ICD-10-CM

## 2023-08-24 LAB — SENTARA SPECIMEN COLLECTION

## 2023-08-24 NOTE — Other (Addendum)
 PCP is aware of the surgery. Instructed to bring Insurance card and ID on the day of surgery.  Advance directives status: none     SURGICAL PRE-ADMISSION INSTRUCTIONS    Your Medication instructions:    Take or Use these medications the morning of surgery with a sip of water: Amlodipine , Gabapentin, Famotidine     Do Not Take or Use morning of surgery: Robaxin, multi-vitamin    Follow MD instructions for pre-op management of: NONE     Follow surgeon's instructions for stopping NSAIDs (i.e. Ibuprofen/Motrin, Advil, BC powder, Aleve/Naproxen, Voltaren cream or tablet, Mobic/Meloxicam, Excedrin, Celebrex, Ketorolac/Toradol, etc). If no instructions provided stop NSAIDs 7 days prior to surgery.    Stop supplements and vitamins (except multivitamins) 2 weeks prior to surgery or now if surgery within 2 weeks of PAT Phone interview.    Patient denies being on any inhalers, eye drops, ointment/lotions/gels, patches, injectable medications, phentermine, supplements, and herbals at this time. Instructed patient to please notify Preanesthesia Testing at [phone] with any medication changes after the PAT phone appointment.    General Day Surgery Instructions    Do NOT eat or drink anything, including water, ice chips, soda, candy, or gum after MIDNIGHT on the night before surgery, unless you have specific instructions from your Surgeon or Anesthesia Provider to do so.   Please follow instructions for cleaning skin with CHG wash kit 3 days prior to surgery as directed.   Please shower with antibacterial bar soap prior to arrival on day of surgery.  No smoking/vaping/chewing tobacco products 24 hours before surgery.  No recreational drugs for one week prior to the day of surgery.  Remove all jewelry/body piercing, nail polish, makeup (including mascara); no lotions, powders, deodorant, or perfume.  Leave all valuables, including money/purse/wallet, jewelry, laptop, etc.. at home.  Bring durable medical equipment (DME)  needed: (NONE)   Bring Stimulator Device Remote on the day of surgery and for testing.   Glasses/Contact lenses may be worn to the hospital.  They will be removed prior to surgery.  Call your doctor if you have symptoms of a cold or illness, rash, wounds, or open areas on your skin prior to surgery. Patient denies any symptoms of illnesses and no open skin areas/rashes/wounds at this time.  More than one visitor is allowed in the waiting room of the [facility] on the day of surgery; however, only 1 visitor is allowed to accompany you in the clinical areas.  AN ADULT MUST DRIVE YOU HOME AFTER OUTPATIENT SURGERY.   If you are having an OUTPATIENT procedure, please make arrangements for a responsible adult to be with you for 24 hours after your surgery.  If you are admitted to the hospital, you will be assigned to a bed after surgery is complete.  Please anticipate being here at the Hospital A MINIMUM of 5-6 hours on the Surgery Day. Please DO NOT make any other appointment for the day of surgery. The staff will inform you and your family if any delays occur on the day of surgery. Please ensure that your designated driver or ride remains on campus for the entire duration of your stay on the day of surgery. This is important for your safety and timely discharge.     Arrival time will be provided to you by your surgeon's office on the day before your procedure/surgery. Normally you will be asked to arrive 2-3 Hours in advance of your procedure.    Patient verbalized understanding of instructions provided  during pre-anesthesia phone interview.  Not able to send above surgery instructions to patient via MyChart-status is pending or inactive.  Sent a link to patient and encouraged patient to activate MyChart as soon as possible. Provided MyChart Help Desk Number [phone]) to the patient.    Pt had labs and EKG on 08/24/23 @ [facility], lab results pending and preliminary EKG Sinus Tach.

## 2023-08-24 NOTE — Other (Signed)
 Pre-Admission Testing Algorithm for Identification of Special Population for                                                       Anesthesia Review                                                            Patient History Triggers That Require Anesthesia Review & Potential Clearance      [] Anesthesia Reaction History: Malignant Hyperthermia (including family hx of MH) and Pseudocholinesterase Deficiency  [] Arrhythmias (Significant) or Conduction abnormalities (e.g., A-Fib, A-Flutter, SVT, PSVT, VT, V-Fib, Bradycardia with HR < 40, Sick Sinus Syndrome, Bi/Trigeminy, PACs/PVCs and accessory pathway tachycardias such as Wolf-Parkinson-White)  [] Cardiac History: CHF, CAD, Heart Valve Disorders (Aortic Stenosis, Mitral Regurgitation and Atresia), Cardiomyopathy, Aneurysms, All Ischemia, LBBB, Septal Defects, New/Recent Infarcts and Angina, and Pacemakers/AICDs-  [] Pulmonary History: Severe COPD: Emphysema/Chronic Bronchitis (recent exacerbation within 6 weeks of surgery), Pulmonary Fibrosis, Pulmonary HTN, and on Home Oxygen  [] Recent URI within 30 days of surgery (for example: Covid, pneumonia, bronchitis, etc.)  [] Kidney History: Chronic Renal Failure, on Hemodialysis or Peritoneal Dialysis  [] Hepatic History: Liver Failure, Cirrhosis, Ascites  [] HgA1c > 8.0 or on insulin pump  [] Hgb < 10  [] CVA/Stroke History/Tia  [] Seizure/Epilepsy History  [] PVD/PAD  [] Head and Neck Cancers History: Chemotherapy/Radiation Treatment  [] Bleeding and Clotting Disorders: Hemophilia, Von Willebrand (DDAVP, Factor V Leiden, Factor Deficiencies Disorders, Thrombocytopenia, DIC, Sickle Cell Disease, Hereditary Hemorrhagic Telangiectasia (HHT) and Prothrombin Gene Mutation  [] Rare Diseases: Cystic Fibrosis, Muscular Dystrophies, Phenylketonuria (PKU), Achalasia, etc.  [] Autoimmune Diseases(to include the following): Type-1 Diabetes, RA, Lupus, Multiple Sclerosis, Addison's and Graves's Diseases, Hashimoto's Thyroiditis, Myasthenia  Gravis, Scleroderma, Sjogren's Syndrome, Pernicious Anemia, Autoimmune Vasculitis  [] Transplant History: Organ or Bone Marrow   [] Abnormal Labs: Elevated Creatinine, Elevated Liver Enzymes, Elevated and Low Potassium and Sodium, Low Platelets   [] Blood Clots History: DVT/PE (if treated with anticoagulation- request instructions for Pre-Op management)-Do Not Require Anesthesia Review  [] If BMI 50 or greater, then sent chart to anesthesia for review for possible airway check.  [x] Patient Does not meet Spec Pop Criteria

## 2023-08-25 LAB — EKG 12-LEAD
Atrial Rate: 115 {beats}/min
P Axis: 66 degrees
P-R Interval: 146 ms
Q-T Interval: 340 ms
QRS Duration: 80 ms
QTc Calculation (Bazett): 470 ms
R Axis: 55 degrees
T Axis: 52 degrees
Ventricular Rate: 115 {beats}/min

## 2023-08-25 NOTE — Discharge Instructions (Addendum)
 Dr. Venus Post-Operative Instructions Thoracic/Lumbar/Sacral Spine Surgery    DO NOT take any NSAIDS if you had Fusion Surgery.     ACTIVITIES:  *The first week after surgery   Change positions every hour while you are awake.  Walking is the best way to rebuild strength.   Activities around the house, such as washing dishes and preparing light meals are fine.   Avoid strenuous activities, such as vacuuming, and do not lift anything heavier than 1 gallon of milk (or about 5-8 pounds).   Do not bend over to pick up items from the ground level until 3 months post-op.    *Week 2 and beyond  You may gradually increase your activities, but avoid heavy lifting, pushing/pulling.   Walk at a pace that avoids fatigue or severe pain. Do not try to walk several blocks the first day! As you increase the distance, you may feel tired. If so, stop and rest.   Follow-up with Dr. Alaine will be 2 weeks after surgery.    BATHING and INCISION CARE:  The incision may be tender or feel numb: this is normal.   Keep the incision clean and dry. You may shower 3 days after surgery. Cover the dressing with saran wrap before getting in the shower. The incision is closed with sutures under the skin and glue on top.   Do not apply any lotions, ointments or oils on the incision.   Do not remove the dressing. Your dressing will be changed at your first post op appointment. If it comes loose or is damaged, dirty or wet before this appointment, call your home health nurse (if you are being seen by a nurse at home) or the office to have the dressing changed.  If you notice any excessive swelling, redness, or persistent drainage around the incision, notify the office immediately.    CONSTIPATION:  Take a stool softener twice a day while you are taking a narcotic.   If you have not had a bowel movement within 3 days of surgery, you will need to use a laxative or suppository that can be obtained over the counter at your local pharmacy      ICE  Use ice on your back to decrease pain and swelling.  Do NOT use heat.    MEDICATIONS:  If you had fusion surgery DO NOT TAKE non-steroidal anti-inflammatory (NSAID) medications, such as Motrin, Aleve, Advil Naprosyn, Ibuprofen or aspirin.   Take Tylenol/Acetaminophen every 4-6 hours for pain. Do not take more than 3000 mg each day. (Do not take Tylenol/Acetaminophen if you have liver problems).  Take your prescribed narcotic pain medication as needed for pain that is not tolerable.    Eat food before you take any pain medication to avoid nausea.    If you need a medication refill, please call the office during working hours at least 2 days before your prescription runs out. Do not wait until your bottle is empty to call for a refill.         NUTRITION:  Eat healthy to help your wound to heal.    Eat a healthy balanced diet to help your wound to heal. Protein supplements should be considered if you are eating less than 50% of your meal.   Drink plenty of water to stay hydrated.    DRIVING & RETURN TO WORK:   You will be told at your follow up appointment if it is safe for you to drive or return to work and  will be provided with a return-to-work-note if needed (please ask).   NEVER drive while taking narcotic medication.    WHEN TO CALL THE OFFICE:  If you have severe pain unrelieved by the medications, new numbness or tingling in your legs;        If you have a fever of 101.51F or greater   If you notice increased swelling, redness, or increased drainage from the incision    If you are not able to urinate  If you are not able to control your bowels       [facility] number is [phone].  They are open from 8:00am to 5:00pm Mon - Fri. After 5:00pm, or on weekends/holidays, please call the answering service at [phone] for a call back.      DISCHARGE SUMMARY from Nurse    PATIENT INSTRUCTIONS:    After general anesthesia or intravenous sedation, for 24 hours or while taking prescription  Narcotics:  Limit your activities  Do not drive and operate hazardous machinery  Do not make important personal or business decisions  Do  not drink alcoholic beverages  If you have not urinated within 8 hours after discharge, please contact your surgeon on call.    Report the following to your surgeon:  Excessive pain, swelling, redness or odor of or around the surgical area  Temperature over 100.5  Nausea and vomiting lasting longer than 4 hours or if unable to take medications  Any signs of decreased circulation or nerve impairment to extremity: change in color, persistent  numbness, tingling, coldness or increase pain  Any questions    What to do at Home:  Recommended activity: activity as tolerated and no driving for today until advised.    If you experience any of the following symptoms above, please follow up with Dr. alaine.    *  Please give a list of your current medications to your Primary Care Provider.    *  Please update this list whenever your medications are discontinued, doses are      changed, or new medications (including over-the-counter products) are added.    *  Please carry medication information at all times in case of emergency situations.    These are general instructions for a healthy lifestyle:    No smoking/ No tobacco products/ Avoid exposure to second hand smoke  Surgeon General's Warning:  Quitting smoking now greatly reduces serious risk to your health.    Obesity, smoking, and sedentary lifestyle greatly increases your risk for illness    A healthy diet, regular physical exercise & weight monitoring are important for maintaining a healthy lifestyle    You may be retaining fluid if you have a history of heart failure or if you experience any of the following symptoms:  Weight gain of 3 pounds or more overnight or 5 pounds in a week, increased swelling in our hands or feet or shortness of breath while lying flat in bed.  Please call your doctor as soon as you notice any of these  symptoms; do not wait until your next office visit.     Patient armband removed and shredded     The discharge information has been reviewed with the patient and caregiver.  The patient and caregiver verbalized understanding.  Discharge medications reviewed with the patient and caregiver and appropriate educational materials and side effects teaching were provided.           Nausea and Vomiting After Surgery: Care Instructions  Your Care Instructions     After you've had surgery, you may feel sick to your stomach (nauseated) or you may vomit. Sometimes anesthesia can make you feel sick. It's a common side effect and often doesn't last Jessah Danser. Pain also can make you feel sick or vomit. After the anesthesia wears off, you may feel pain from the incision (cut). That pain could then upset your stomach. Taking pain medicine can also make you feel sick to your stomach.  Whatever the cause, you may get medicine that can help. There are also some things you can do at home to prevent nausea and feel better.  The doctor has checked you carefully, but problems can develop later. If you notice any problems or new symptoms, get medical treatment right away.  Follow-up care is a key part of your treatment and safety. Be sure to make and go to all appointments, and call your doctor if you are having problems. It's also a good idea to know your test results and keep a list of the medicines you take.  How can you care for yourself at home?  Take your pain medicine as soon as you have pain. It works better if you take it before the pain gets bad.  Call your doctor if you have any problems with your medicine.  To prevent dehydration, drink plenty of fluids. Choose water and other clear liquids until you feel better. If you have kidney, heart, or liver disease and have to limit fluids, talk with your doctor before you increase the amount of fluids you drink.  When you are able to eat, try clear soups, mild foods, and liquids until all  symptoms are gone for 12 to 48 hours. Other good choices include dry toast, crackers, cooked cereal, and gelatin dessert, such as Jell-O.  Do not smoke. Smoking and being around smoke can make nausea worse.   When should you call for help?    Call your doctor now or seek immediate medical care if:    You have new or worse nausea or vomiting.     You are too sick to your stomach to drink any fluids.     You cannot keep down fluids.     You have symptoms of dehydration, such as:  Dry eyes and a dry mouth.  Passing only a little urine.  Feeling thirstier than usual.     You are dizzy or lightheaded, or you feel like you may faint.           Infection After Surgery: Care Instructions  Overview  After surgery, an infection is always possible. It doesn't mean that the surgery didn't go well.  How can you care for yourself at home?  Make sure your surgeon knows about the infection, especially if you saw another doctor about your symptoms.  If your doctor prescribed antibiotics, take them as directed. Do not stop taking them just because you feel better. You need to take the full course of antibiotics.  Keep the skin clean and dry.  You may have a bandage over the cut (incision). A bandage helps the incision heal and protects it. Your doctor will tell you how to take care of this. Keep it clean and dry. You may have drainage from the wound.     Call your doctor now or seek immediate medical care if:    You have signs of an infection such as:  Increased pain, swelling, warmth, or redness in the area.  Red  streaks leading from the area.  Pus draining from the wound.  A new or higher fever.       Bleeding After Surgery: Care Instructions  Overview  After surgery, it is common to have some minor bruising or bleeding from the cut (incision) made by your doctor. But problems may occur that cause you to bleed too much in the surgery area.  An injury to a blood vessel can cause bleeding after surgery. Other causes include medicines  such as aspirin or anticoagulants (blood thinners).  To help stop the bleeding, your doctor may have put pressure on the incision or sewn up or cauterized (sealed) the incision. Or you may have had surgery to stop bleeding inside the surgery area. Your doctor also may have given you medicines that help stop the bleeding.  How can you care for yourself at home?  If you have strips of tape on the incision, leave the tape on until it falls off. Or follow your doctor's instructions for removing the tape. Keep the area dry at all times.  You will have a dressing over the incision. A dressing helps the incision heal and protects it. Your doctor will tell you how to take care of this.  If you do not have tape on the incision, wash the area daily with warm, soapy water, and pat it dry. Don't use hydrogen peroxide or alcohol, which can slow healing.    When should you call for help?    Call your doctor now or seek immediate medical care if:    You are dizzy or lightheaded, or you feel like you may faint.     You have bleeding that starts again or gets worse, such as soaking one or more bandages over 2 to 4 hours, even after holding pressure on the area.         __________________________________________________________________________________________________________________________________

## 2023-08-25 NOTE — H&P (Signed)
 Pre-Admission History and Physical    Patient: Jennifer Johnston   MRN: [id number]   SSN: [id number]   Date of Birth: 11/24/1960   Age: 63 y.o.   Sex: female     Patient scheduled for: Intrathecal Pump Removal.  Date of surgery: 08/26/2023.  Surgeon: Oneil WENDI March, MD    HPI:  Jennifer Johnston is a 63 y.o. female with chronic pain, current intrathecal pump which is now stopped working and developed a leak. She has uncontrolled pain for which we discussed revision of pump vs removal and pain management with medication. She is electing for removal. Pain has impacted the patient's functional ability. She is being admitted for surgical intervention.         Past Medical History:   Diagnosis Date    Acute nontraumatic kidney injury 11/2021    Due to NSAIDS and per pt diurectics, once stopped kidney function back to normal    Chronic pain 2025    in pain management with Dr.Che Torry    Exercise tolerance finding 08/24/2023    Pt stated that she can climb stairs and walk a block or two without SOB/chest pain    GERD (gastroesophageal reflux disease) 2003    on meds, well controlled    History of blood transfusion 2011    after back surgery    Hyperlipidemia 2019    takes fish oil    Hypertension 2020    on meds, managed by PCP Dr.Rober Katrinka, also Cindia Gong PA  LOV Jan 2025    Lumbago with sciatica 2025    managed by Dr. Roselinda Melody pain management LOV May 2025    PONV (postoperative nausea and vomiting)     treated with IV medications in the past    Vitamin D deficiency     not on supplements at this time    Wears glasses      Social History     Socioeconomic History    Marital status: Married   Tobacco Use    Smoking status: Never    Smokeless tobacco: Never   Vaping Use    Vaping status: Never Used   Substance and Sexual Activity    Alcohol use: Not Currently    Drug use: Not Currently     Past Surgical History:   Procedure Laterality Date    CESAREAN SECTION  2003    CHOLECYSTECTOMY  2009    COLONOSCOPY  2015     HYSTERECTOMY (CERVIX STATUS UNKNOWN)  2007    INTRATHECAL PUMP IMPLANTATION  2021    Dr.Kerner    LUMBAR FUSION  2011    L5-S1    TONSILLECTOMY  1980     No family history on file.  Allergies   Allergen Reactions    Shrimp Extract Anaphylaxis    Iodinated Contrast Media Rash    Sulfa Antibiotics Hives and Itching     No current facility-administered medications for this encounter.     Current Outpatient Medications   Medication Sig Dispense Refill    betamethasone valerate (VALISONE) 0.1 % cream Apply topically 2 times daily Apply topically 2 times daily.      Iron-Vitamins (GERITOL PO) Take by mouth daily      Multiple Vitamins-Minerals (ALIVE ONCE DAILY WOMENS PO) Take by mouth      BIOTIN PO Take by mouth      Omega-3 Fatty Acids (FISH OIL HIGH POTENCY PO) Take by mouth      famotidine (PEPCID) 20 MG tablet  Take 1 tablet by mouth daily as needed Indications: GERD      amLODIPine (NORVASC) 5 MG tablet Take 1 tablet by mouth every morning Indications: HTN      gabapentin (NEURONTIN) 600 MG tablet Take 1 tablet by mouth 2 times daily. Indications: nerve pain      methocarbamol (ROBAXIN) 750 MG tablet Take 750 mg by mouth every 8 hours as needed         ROS:  Denies chills, fever,night sweats,  bowel or bladder dysfunction, unexplained weight loss/weight gain, chest pain, sob or anxiety.    Physical Examination    Gen: Well developed, well nourished 63 y.o. female Benign appearing pump. Normal strength, sensations, reflexes.     Assessment and Plan    Due to the pt's persistent symptoms and current malfunctioning of pump Jennifer Johnston is being admitted to undergo surgical intervention for removal of pump. The post-operative plan of care consists a 2 week f/u office visit and further pain management with her PM provider.  The risks, benefits, complications and alternatives to surgery have been discussed in detail with the patient.  The patient understands and agrees to proceed.

## 2023-08-26 ENCOUNTER — Inpatient Hospital Stay: Payer: Medicaid (Managed Care) | Attending: Orthopaedic Surgery

## 2023-08-26 ENCOUNTER — Ambulatory Visit: Admit: 2023-08-26 | Payer: Medicaid (Managed Care) | Primary: Family Medicine

## 2023-08-26 MED ORDER — OXYCODONE HCL 5 MG PO TABS
5 | Freq: Once | ORAL | Status: DC | PRN
Start: 2023-08-26 — End: 2023-08-26

## 2023-08-26 MED ORDER — MIDAZOLAM HCL 2 MG/2ML IJ SOLN
2 | Freq: Once | INTRAMUSCULAR | Status: DC | PRN
Start: 2023-08-26 — End: 2023-08-26
  Administered 2023-08-26: 12:00:00 2 via INTRAVENOUS

## 2023-08-26 MED ORDER — GLYCOPYRROLATE 0.4 MG/2ML IJ SOLN
0.4 | INTRAMUSCULAR | Status: AC
Start: 2023-08-26 — End: ?

## 2023-08-26 MED ORDER — METOCLOPRAMIDE HCL 5 MG/ML IJ SOLN
5 | INTRAMUSCULAR | Status: AC
Start: 2023-08-26 — End: ?

## 2023-08-26 MED ORDER — SUGAMMADEX SODIUM 200 MG/2ML IV SOLN
200 | INTRAVENOUS | Status: AC
Start: 2023-08-26 — End: ?

## 2023-08-26 MED ORDER — TRANEXAMIC ACID-NACL 1000-0.7 MG/100ML-% IV SOLN
1000-0.7 | INTRAVENOUS | Status: AC
Start: 2023-08-26 — End: ?

## 2023-08-26 MED ORDER — SUCCINYLCHOLINE CHLORIDE 100 MG/5ML IV SOSY
100 | INTRAVENOUS | Status: AC
Start: 2023-08-26 — End: ?

## 2023-08-26 MED ORDER — FENTANYL CITRATE (PF) 100 MCG/2ML IJ SOLN
100 | INTRAMUSCULAR | Status: AC
Start: 2023-08-26 — End: ?

## 2023-08-26 MED ORDER — FENTANYL CITRATE (PF) 100 MCG/2ML IJ SOLN
100 | INTRAMUSCULAR | Status: DC | PRN
Start: 2023-08-26 — End: 2023-08-26

## 2023-08-26 MED ORDER — METOCLOPRAMIDE HCL 5 MG/ML IJ SOLN
5 | Freq: Once | INTRAMUSCULAR | Status: DC | PRN
Start: 2023-08-26 — End: 2023-08-26
  Administered 2023-08-26: 12:00:00 10 via INTRAVENOUS

## 2023-08-26 MED ORDER — LIDOCAINE HCL (PF) 2 % IJ SOLN
2 | INTRAMUSCULAR | Status: AC
Start: 2023-08-26 — End: ?

## 2023-08-26 MED ORDER — DROPERIDOL 2.5 MG/ML IJ SOLN
2.5 | Freq: Once | INTRAMUSCULAR | Status: DC | PRN
Start: 2023-08-26 — End: 2023-08-26

## 2023-08-26 MED ORDER — ROCURONIUM BROMIDE 50 MG/5ML IV SOLN
50 | Freq: Once | INTRAVENOUS | Status: DC | PRN
Start: 2023-08-26 — End: 2023-08-26
  Administered 2023-08-26: 12:00:00 25 via INTRAVENOUS

## 2023-08-26 MED ORDER — PROPOFOL 200 MG/20ML IV EMUL
200 | INTRAVENOUS | Status: AC
Start: 2023-08-26 — End: ?

## 2023-08-26 MED ORDER — HYDROMORPHONE HCL 1 MG/ML IJ SOLN
1 | Freq: Once | INTRAMUSCULAR | Status: DC | PRN
Start: 2023-08-26 — End: 2023-08-26
  Administered 2023-08-26 (×2): .5 via INTRAVENOUS

## 2023-08-26 MED ORDER — ONDANSETRON HCL 4 MG/2ML IJ SOLN
4 | INTRAMUSCULAR | Status: AC
Start: 2023-08-26 — End: ?

## 2023-08-26 MED ORDER — IPRATROPIUM-ALBUTEROL 0.5-2.5 (3) MG/3ML IN SOLN
0.5-2.5 | Freq: Once | RESPIRATORY_TRACT | Status: DC | PRN
Start: 2023-08-26 — End: 2023-08-26

## 2023-08-26 MED ORDER — APREPITANT 40 MG PO CAPS
40 | Freq: Once | ORAL | Status: AC
Start: 2023-08-26 — End: 2023-08-26
  Administered 2023-08-26: 11:00:00 40 mg via ORAL

## 2023-08-26 MED ORDER — SODIUM CHLORIDE 0.9 % IV SOLN
0.9 | INTRAVENOUS | Status: DC | PRN
Start: 2023-08-26 — End: 2023-08-26

## 2023-08-26 MED ORDER — PROPOFOL 200 MG/20ML IV EMUL
200 | Freq: Once | INTRAVENOUS | Status: DC | PRN
Start: 2023-08-26 — End: 2023-08-26
  Administered 2023-08-26: 12:00:00 150 via INTRAVENOUS
  Administered 2023-08-26: 13:00:00 50 via INTRAVENOUS

## 2023-08-26 MED ORDER — HYDROMORPHONE HCL PF 1 MG/ML IJ SOLN
1 | INTRAMUSCULAR | Status: DC | PRN
Start: 2023-08-26 — End: 2023-08-26

## 2023-08-26 MED ORDER — SODIUM CHLORIDE 0.9 % IV SOLN (ADDEASE)
0.9 | INTRAVENOUS | Status: AC
Start: 2023-08-26 — End: 2023-08-26
  Administered 2023-08-26: 11:00:00 1000 mg/kg via INTRAVENOUS

## 2023-08-26 MED ORDER — VANCOMYCIN HCL 1 G IV SOLR
1 | INTRAVENOUS | Status: AC
Start: 2023-08-26 — End: ?

## 2023-08-26 MED ORDER — LACTATED RINGERS IV SOLN
INTRAVENOUS | Status: DC
Start: 2023-08-26 — End: 2023-08-26
  Administered 2023-08-26: 13:00:00 via INTRAVENOUS

## 2023-08-26 MED ORDER — LABETALOL HCL 5 MG/ML IV SOLN
5 | INTRAVENOUS | Status: DC | PRN
Start: 2023-08-26 — End: 2023-08-26

## 2023-08-26 MED ORDER — TRANEXAMIC ACID-NACL 1000-0.7 MG/100ML-% IV SOLN
1000-0.7 | Freq: Once | INTRAVENOUS | Status: AC
Start: 2023-08-26 — End: 2023-08-26
  Administered 2023-08-26: 12:00:00 1000 mg via INTRAVENOUS

## 2023-08-26 MED ORDER — DIPHENHYDRAMINE HCL 50 MG/ML IJ SOLN
50 | Freq: Once | INTRAMUSCULAR | Status: DC | PRN
Start: 2023-08-26 — End: 2023-08-26

## 2023-08-26 MED ORDER — HYDROMORPHONE HCL 1 MG/ML IJ SOLN
1 | INTRAMUSCULAR | Status: AC
Start: 2023-08-26 — End: ?

## 2023-08-26 MED ORDER — VANCOMYCIN HCL 1 G IV SOLR
1 | INTRAVENOUS | Status: DC | PRN
Start: 2023-08-26 — End: 2023-08-26
  Administered 2023-08-26: 13:00:00 1000 via TOPICAL

## 2023-08-26 MED ORDER — ROCURONIUM BROMIDE 50 MG/5ML IV SOLN
50 | INTRAVENOUS | Status: AC
Start: 2023-08-26 — End: ?

## 2023-08-26 MED ORDER — NORMAL SALINE FLUSH 0.9 % IV SOLN
0.9 | INTRAVENOUS | Status: DC | PRN
Start: 2023-08-26 — End: 2023-08-26

## 2023-08-26 MED ORDER — LACTATED RINGERS IV SOLN
INTRAVENOUS | Status: DC
Start: 2023-08-26 — End: 2023-08-26
  Administered 2023-08-26: 11:00:00 via INTRAVENOUS

## 2023-08-26 MED ORDER — OXYCODONE HCL 5 MG PO TABS
5 | ORAL_TABLET | Freq: Four times a day (QID) | ORAL | 0 refills | Status: AC | PRN
Start: 2023-08-26 — End: 2023-09-02
  Filled 2023-08-26: qty 28, 7d supply, fill #0

## 2023-08-26 MED ORDER — MEPERIDINE HCL 50 MG/ML IJ SOLN
50 | INTRAMUSCULAR | Status: DC | PRN
Start: 2023-08-26 — End: 2023-08-26

## 2023-08-26 MED ORDER — ONDANSETRON HCL 4 MG/2ML IJ SOLN
4 | Freq: Once | INTRAMUSCULAR | Status: DC | PRN
Start: 2023-08-26 — End: 2023-08-26

## 2023-08-26 MED ORDER — NORMAL SALINE FLUSH 0.9 % IV SOLN
0.9 | Freq: Two times a day (BID) | INTRAVENOUS | Status: DC
Start: 2023-08-26 — End: 2023-08-26

## 2023-08-26 MED ORDER — BUPIVACAINE-EPINEPHRINE (PF) 0.25% -1:200000 IJ SOLN
INTRAMUSCULAR | Status: DC | PRN
Start: 2023-08-26 — End: 2023-08-26
  Administered 2023-08-26: 13:00:00 30 via INTRAMUSCULAR

## 2023-08-26 MED ORDER — DEXAMETHASONE SODIUM PHOSPHATE 4 MG/ML IJ SOLN
4 | Freq: Once | INTRAMUSCULAR | Status: DC | PRN
Start: 2023-08-26 — End: 2023-08-26
  Administered 2023-08-26: 12:00:00 4 via INTRAVENOUS

## 2023-08-26 MED ORDER — DEXAMETHASONE SODIUM PHOSPHATE 4 MG/ML IJ SOLN
4 | INTRAMUSCULAR | Status: AC
Start: 2023-08-26 — End: ?

## 2023-08-26 MED ORDER — SUGAMMADEX SODIUM 200 MG/2ML IV SOLN
200 | Freq: Once | INTRAVENOUS | Status: DC | PRN
Start: 2023-08-26 — End: 2023-08-26
  Administered 2023-08-26: 13:00:00 133 via INTRAVENOUS

## 2023-08-26 MED ORDER — LIDOCAINE HCL (PF) 2 % IJ SOLN
2 | Freq: Once | INTRAMUSCULAR | Status: DC | PRN
Start: 2023-08-26 — End: 2023-08-26
  Administered 2023-08-26: 12:00:00 100 via INTRAVENOUS

## 2023-08-26 MED ORDER — BUPIVACAINE-EPINEPHRINE (PF) 0.25% -1:200000 IJ SOLN
INTRAMUSCULAR | Status: AC
Start: 2023-08-26 — End: ?

## 2023-08-26 MED ORDER — ONDANSETRON HCL 4 MG/2ML IJ SOLN
4 | Freq: Once | INTRAMUSCULAR | Status: DC | PRN
Start: 2023-08-26 — End: 2023-08-26
  Administered 2023-08-26: 13:00:00 4 via INTRAVENOUS

## 2023-08-26 MED ORDER — SODIUM CHLORIDE 0.9 % IR SOLN
0.9 | Status: DC | PRN
Start: 2023-08-26 — End: 2023-08-26
  Administered 2023-08-26: 13:00:00 500

## 2023-08-26 MED ORDER — MIDAZOLAM HCL 2 MG/2ML IJ SOLN
2 | INTRAMUSCULAR | Status: AC
Start: 2023-08-26 — End: ?

## 2023-08-26 MED ORDER — NALOXONE HCL 0.4 MG/ML IJ SOLN
0.4 | INTRAMUSCULAR | Status: DC | PRN
Start: 2023-08-26 — End: 2023-08-26

## 2023-08-26 MED ORDER — FENTANYL CITRATE (PF) 100 MCG/2ML IJ SOLN
100 | Freq: Once | INTRAMUSCULAR | Status: DC | PRN
Start: 2023-08-26 — End: 2023-08-26
  Administered 2023-08-26: 12:00:00 100 via INTRAVENOUS

## 2023-08-26 MED FILL — HYDROMORPHONE HCL 1 MG/ML IJ SOLN: 1 mg/mL | INTRAMUSCULAR | Qty: 1

## 2023-08-26 MED FILL — TRANEXAMIC ACID-NACL 1000-0.7 MG/100ML-% IV SOLN: 1000-0.7 MG/100ML-% | INTRAVENOUS | Qty: 100

## 2023-08-26 MED FILL — LACTATED RINGERS IV SOLN: INTRAVENOUS | Qty: 1000

## 2023-08-26 MED FILL — SUCCINYLCHOLINE CHLORIDE 100 MG/5ML IV SOSY: 100 MG/5ML | INTRAVENOUS | Qty: 5

## 2023-08-26 MED FILL — MIDAZOLAM HCL 2 MG/2ML IJ SOLN: 2 mg/mL | INTRAMUSCULAR | Qty: 2

## 2023-08-26 MED FILL — XYLOCAINE-MPF 2 % IJ SOLN: 2 % | INTRAMUSCULAR | Qty: 5

## 2023-08-26 MED FILL — BRIDION 200 MG/2ML IV SOLN: 200 MG/2ML | INTRAVENOUS | Qty: 2

## 2023-08-26 MED FILL — ONDANSETRON HCL 4 MG/2ML IJ SOLN: 4 MG/2ML | INTRAMUSCULAR | Qty: 2

## 2023-08-26 MED FILL — SODIUM CHLORIDE 0.9 % IV SOLN: 0.9 % | INTRAVENOUS | Qty: 250

## 2023-08-26 MED FILL — APREPITANT 40 MG PO CAPS: 40 mg | ORAL | Qty: 1

## 2023-08-26 MED FILL — SENSORCAINE-MPF/EPINEPHRINE 0.25% -1:200000 IJ SOLN: INTRAMUSCULAR | Qty: 30

## 2023-08-26 MED FILL — METOCLOPRAMIDE HCL 5 MG/ML IJ SOLN: 5 mg/mL | INTRAMUSCULAR | Qty: 2

## 2023-08-26 MED FILL — DEXAMETHASONE SODIUM PHOSPHATE 4 MG/ML IJ SOLN: 4 mg/mL | INTRAMUSCULAR | Qty: 1

## 2023-08-26 MED FILL — BD POSIFLUSH 0.9 % IV SOLN: 0.9 % | INTRAVENOUS | Qty: 40

## 2023-08-26 MED FILL — VANCOMYCIN HCL 1 G IV SOLR: 1 g | INTRAVENOUS | Qty: 1000

## 2023-08-26 MED FILL — PROPOFOL 200 MG/20ML IV EMUL: 200 MG/20ML | INTRAVENOUS | Qty: 20

## 2023-08-26 MED FILL — ROCURONIUM BROMIDE 50 MG/5ML IV SOLN: 50 MG/5ML | INTRAVENOUS | Qty: 5

## 2023-08-26 MED FILL — GLYCOPYRROLATE 0.4 MG/2ML IJ SOLN: 0.4 MG/2ML | INTRAMUSCULAR | Qty: 2

## 2023-08-26 MED FILL — FENTANYL CITRATE (PF) 100 MCG/2ML IJ SOLN: 100 MCG/2ML | INTRAMUSCULAR | Qty: 2

## 2023-08-26 NOTE — Other (Signed)
 Reviewed PTA medication list with patient/caregiver and patient/caregiver denies any additional medications.     Patient admits to having a responsible adult care for them at home for at least 24 hours after surgery.    Patient encouraged to use gown warming system and informed that using said warming gown to regulate body temperature prior to a procedure has been shown to help reduce the risks of blood clots and infection.    Patient's pharmacy of choice verified and documented in PTA medication section.    Dual skin assessment & fall risk band verification completed with Faith A. RN.

## 2023-08-26 NOTE — Interval H&P Note (Signed)
 Update History & Physical    The patient's History and Physical of July 26, 2023 was reviewed with the patient and I examined the patient. There was no change. The surgical site was confirmed by the patient and me.     Plan: The risks, benefits, expected outcome, and alternative to the recommended procedure have been discussed with the patient. Patient understands and wants to proceed with the procedure.     Electronically signed by ONEIL KATHEE MARCH, MD on 08/26/2023 at 6:20 AM

## 2023-08-26 NOTE — Other (Signed)
 TRANSFER - IN REPORT:    Verbal report received from [place], RN on Jennifer Johnston  being received from PACU for routine progression of patient care      Report consisted of patient's Situation, Background, Assessment and   Recommendations(SBAR).     Information from the following report(s) Adult Overview, Surgery Report, Intake/Output, MAR, and Recent Results was reviewed with the receiving nurse.    Opportunity for questions and clarification was provided.      Assessment completed upon patient's arrival to unit and care assumed.

## 2023-08-26 NOTE — Anesthesia Post-Procedure Evaluation (Signed)
 Post-Anesthesia Evaluation and Assessment    Cardiovascular Function/Vital Signs  Visit Vitals  BP 115/68   Pulse (!) 101   Temp 97.2 F (36.2 C) (Temporal)   Resp 15   Ht 1.549 m (5' 1)   Wt 66.7 kg (147 lb)   SpO2 97%   BMI 27.78 kg/m       Patient is status post Procedure(s):  INTRATHECAL PUMP REMOVAL WITH C-ARM.    Nausea/Vomiting: Controlled.    Postoperative hydration reviewed and adequate.    Pain:      Managed.    Neurological Status:       At baseline.    Mental Status and Level of Consciousness: Progressing to baseline appropriately     Pulmonary Status:       Adequate oxygenation and airway patent.    Complications related to anesthesia: None    Post-anesthesia assessment completed. No concerns.        Signed By: MABEL LANDER, MD    August 26, 2023

## 2023-08-26 NOTE — Other (Signed)
 TRANSFER - IN REPORT:    Verbal report received from OR Nurse and CRNA on Jennifer Johnston  being received from OR for routine post-op      Report consisted of patient's Situation, Background, Assessment and   Recommendations(SBAR).     Information from the following report(s) Nurse Handoff Report, Adult Overview, Surgery Report, Intake/Output, MAR, and Recent Results was reviewed with the receiving nurse.    Opportunity for questions and clarification was provided.      Assessment completed upon patient's arrival to unit and care assumed.

## 2023-08-26 NOTE — Other (Signed)
 Pt up to bathroom to void with assistance. Returned to stretcher with call light in reach.

## 2023-08-26 NOTE — Other (Signed)
 TRANSFER - OUT REPORT:    Verbal report given to [facility] RN on Jesusa Elder  being transferred to Phase 2 for routine post-op       Report consisted of patient's Situation, Background, Assessment and   Recommendations(SBAR).     Information from the following report(s) Nurse Handoff Report, Adult Overview, Surgery Report, Intake/Output, MAR, and Recent Results was reviewed with the receiving nurse.           Lines:   Peripheral IV 08/26/23 Left;Posterior Forearm (Active)   Site Assessment Clean, dry & intact 08/26/23 0945   Line Status Infusing 08/26/23 0945   Phlebitis Assessment No symptoms 08/26/23 0945   Infiltration Assessment 0 08/26/23 0945   Alcohol Cap Used No 08/26/23 0700   Dressing Status Clean, dry & intact 08/26/23 0945   Dressing Type Transparent 08/26/23 0945        Opportunity for questions and clarification was provided.      Patient transported with:  Registered Nurse

## 2023-08-26 NOTE — Op Note (Signed)
 Operative Note      Patient: Jennifer Johnston  Date of Birth: [birth year]  MRN: [id number]    Date of Procedure: Sep 12, 2023    Pre-Op Diagnosis Codes:      * Encounter for adjustment or management of infusion pump [Z45.1]     * Other chronic pain [G89.29]    Post-Op Diagnosis: Same       Procedure(s):  INTRATHECAL PUMP REMOVAL WITH C-ARM    Surgeon(s):  Alaine Oneil NOVAK, MD    Assistant:   * No surgical staff found *    Anesthesia: General    Estimated Blood Loss (mL): Minimal    Complications: None    Specimens:   * No specimens in log *    Implants:  * No implants in log *      Drains: * No LDAs found *    Findings:  Present At Time Of Surgery (PATOS) (choose all levels that have infection present):  No infection present  Other Findings: 0    Detailed Description of Procedure:   Following induction of general tracheal anesthesia patient was placed in a lateral decubitus position left side up patient was prepped draped usual fashion.  Incision was made over the pump.  Pump dissected free tiedown was removed dense scar furcation resected and the pump removed from the abdominal cavity.  The catheter was delineated.  Incision made in the posterior aspect of the tiedown was delineated the entry point to the fascia was delineated pursestring sutures were made about the fascial entry of the catheter within the suture.  The tiedown was removed and the catheter removed from the intradural portion with the tip intact.  The pursestring suture was secured tightly around the entry path of the epidural catheter and oversewed.  Both incisions were irrigated and closed in layers and the skin closed with subcuticular suture and Dermabond.  Sterile dressing placed upon wound all counts correct estimated blood loss minimal complications none specimens none implants none anesthesia General Endotracheal    Electronically signed by ONEIL NOVAK ALAINE, MD on 09/12/23 at 8:41 AM

## 2023-08-26 NOTE — Other (Signed)
 Anesthesia paged for sign out.

## 2023-08-26 NOTE — Anesthesia Pre-Procedure Evaluation (Signed)
 Department of Anesthesiology  Preprocedure Note       Name:  Jennifer Johnston   Age:  63 y.o.  DOB:  August 27, 1960                                          MRN:  [id number]         Date:  08/26/2023      Surgeon: Clotilde):  Alaine Oneil NOVAK, MD    Procedure: Procedure(s):  INTRATHECAL PUMP REMOVAL WITH C-ARM    Medications prior to admission:   Prior to Admission medications    Medication Sig Start Date End Date Taking? Authorizing Provider   oxyCODONE  (ROXICODONE ) 5 MG immediate release tablet Take 1 tablet by mouth every 6 hours as needed for Pain for up to 7 days. Intended supply: 7 days. Take lowest dose possible to manage pain Max Daily Amount: 20 mg 08/26/23 09/02/23 Yes Alaine Oneil NOVAK, MD   Multiple Vitamins-Minerals (ALIVE ONCE DAILY WOMENS PO) Take by mouth   Yes [provider]   famotidine (PEPCID) 20 MG tablet Take 1 tablet by mouth daily as needed Indications: GERD   Yes [provider]   amLODIPine (NORVASC) 5 MG tablet Take 1 tablet by mouth every morning Indications: HTN   Yes Automatic Reconciliation, Ar   gabapentin (NEURONTIN) 600 MG tablet Take 1 tablet by mouth 2 times daily. Indications: nerve pain   Yes Automatic Reconciliation, Ar   methocarbamol (ROBAXIN) 750 MG tablet Take 1 tablet by mouth every 8 hours as needed   Yes Automatic Reconciliation, Ar   betamethasone valerate (VALISONE) 0.1 % cream Apply topically 2 times daily Apply topically 2 times daily.    [provider]   Iron-Vitamins (GERITOL PO) Take by mouth daily  Patient not taking: Reported on 08/26/2023    [provider]   Omega-3 Fatty Acids (FISH OIL HIGH POTENCY PO) Take by mouth    [provider]       Current medications:    Current Facility-Administered Medications   Medication Dose Route Frequency Provider Last Rate Last Admin   . tranexamic acid -NaCl IVPB premix 1,000 mg  1,000 mg IntraVENous Once Kerner, Mark B, MD       . vancomycin  (VANCOCIN ) 1,000 mg in sodium chloride  0.9 % 250 mL  (addEASE) IVPB  15 mg/kg IntraVENous On Call to OR Alaine Oneil NOVAK, MD 250 mL/hr at 08/26/23 0722 1,000 mg at 08/26/23 9277   . lactated ringers  infusion   IntraVENous Continuous Alaine Oneil NOVAK, MD 125 mL/hr at 08/26/23 0714 NoRateChange at 08/26/23 0714       Allergies:    Allergies   Allergen Reactions   . Shrimp Extract Anaphylaxis   . Iodinated Contrast Media Rash   . Sulfa Antibiotics Hives and Itching       Problem List:  There is no problem list on file for this patient.      Past Medical History:        Diagnosis Date   . Acute nontraumatic kidney injury 11/2021    Due to NSAIDS and per pt diurectics, once stopped kidney function back to normal   . Chronic pain 2025    in pain management with Dr.Che Torry   . Exercise tolerance finding 08/24/2023    Pt stated that she can climb stairs and walk a block or two without SOB/chest pain   .  GERD (gastroesophageal reflux disease) 2003    on meds, well controlled   . History of blood transfusion 2011    after back surgery   . Hyperlipidemia 2019    takes fish oil   . Hypertension 2020    on meds, managed by PCP Dr.Rober Katrinka, also Cindia Gong PA  LOV Jan 2025   . Lumbago with sciatica 2025    managed by Dr. Roselinda Melody pain management LOV May 2025   . PONV (postoperative nausea and vomiting)     treated with IV medications in the past   . Vitamin D deficiency     not on supplements at this time   . Wears glasses        Past Surgical History:        Procedure Laterality Date   . CESAREAN SECTION  2003   . CHOLECYSTECTOMY  2009   . COLONOSCOPY  2015   . HYSTERECTOMY (CERVIX STATUS UNKNOWN)  2007   . INTRATHECAL PUMP IMPLANTATION  2021    Dr.Kerner   . LUMBAR FUSION  2011    L5-S1   . TONSILLECTOMY  1980       Social History:    Social History     Tobacco Use   . Smoking status: Never   . Smokeless tobacco: Never   Substance Use Topics   . Alcohol use: Not Currently                                Counseling given: Not Answered      Vital Signs (Current):   Vitals:     08/26/23 0623 08/26/23 0710   BP: (!) 154/77    Pulse: (!) 126 (!) 113   Resp: 16    Temp: 97.9 F (36.6 C)    TempSrc: Temporal    SpO2: 100%    Weight: 66.7 kg (147 lb)    Height: 1.549 m (5' 1)                                               BP Readings from Last 3 Encounters:   08/26/23 (!) 154/77       NPO Status: Time of last liquid consumption:  (clears > 2h)                        Time of last solid consumption:  (solids > 8h)                        Date of last liquid consumption: 08/25/23                        Date of last solid food consumption: 08/25/23    BMI:   Wt Readings from Last 3 Encounters:   08/26/23 66.7 kg (147 lb)   08/24/23 64.9 kg (143 lb)   11/30/20 60.7 kg (133 lb 13.1 oz)     Body mass index is 27.78 kg/m.    CBC:   Lab Results   Component Value Date/Time    WBC 7.1 11/30/2020 01:55 PM    RBC 3.14 11/30/2020 01:55 PM    HGB 9.7 11/30/2020 01:55 PM    HCT 29.6 11/30/2020 01:55 PM  MCV 94 11/30/2020 01:55 PM    RDW 15.2 11/30/2020 01:55 PM    PLT 270 11/30/2020 01:55 PM       CMP:   Lab Results   Component Value Date/Time    NA 143 11/30/2020 01:55 PM    K 4.2 11/30/2020 01:55 PM    CL 106 11/30/2020 01:55 PM    CO2 29 11/30/2020 01:55 PM    BUN 6 11/30/2020 01:55 PM    CREATININE 1.0 11/30/2020 01:55 PM    LABGLOM >60.0 11/30/2020 01:55 PM    GLUCOSE 122 11/30/2020 01:55 PM    CALCIUM 8.8 11/30/2020 01:55 PM       POC Tests: No results for input(s): POCGLU, POCNA, POCK, POCCL, POCBUN, POCHEMO, POCHCT in the last 72 hours.    Coags: No results found for: PROTIME, INR, APTT    HCG (If Applicable): No results found for: PREGTESTUR, PREGSERUM, HCG, HCGQUANT     ABGs: No results found for: PHART, PO2ART, PCO2ART, HCO3ART, BEART, O2SATART     Type & Screen (If Applicable):  No results found for: ABORH, LABANTI    Drug/Infectious Status (If Applicable):  No results found for: HIV, HEPCAB    COVID-19 Screening (If Applicable): No results found  for: COVID19        Anesthesia Evaluation     history of anesthetic complications: PONV.  Airway: Mallampati: II  TM distance: >3 FB   Neck ROM: full  Mouth opening: > = 3 FB   Dental: normal exam         Pulmonary:Negative Pulmonary ROS and normal exam                               Cardiovascular:  Exercise tolerance: good (>4 METS)  (+) hypertension:                  Neuro/Psych:   Negative Neuro/Psych ROS              GI/Hepatic/Renal:   (+) GERD: well controlled          Endo/Other: Negative Endo/Other ROS                    Abdominal:             Vascular: negative vascular ROS.         Other Findings:       Anesthesia Plan      general     ASA 2       Induction: intravenous.      Anesthetic plan and risks discussed with patient.      Plan discussed with CRNA.                MABEL LANDER, MD   08/26/2023

## 2023-08-26 NOTE — Brief Op Note (Signed)
 Brief Postoperative Note      Patient: Jennifer Johnston  Date of Birth: [birth year]  MRN: [id number]    Date of Procedure: 2023/09/02    Pre-Op Diagnosis Codes:      * Encounter for adjustment or management of infusion pump [Z45.1]     * Other chronic pain [G89.29]    Post-Op Diagnosis: Same       Procedure(s):  INTRATHECAL PUMP REMOVAL WITH C-ARM    Surgeon(s):  Alaine Oneil NOVAK, MD    Assistant:  * No surgical staff found *    Anesthesia: General    Estimated Blood Loss (mL): Minimal    Complications: None    Specimens:   * No specimens in log *    Implants:  * No implants in log *      Drains: * No LDAs found *    Findings:  Present At Time Of Surgery (PATOS) (choose all levels that have infection present):  No infection present  Other Findings: 0    Electronically signed by ONEIL NOVAK ALAINE, MD on 09-02-2023 at 8:41 AM

## 2023-08-26 NOTE — Other (Signed)
 Discharge instructions given to patient and caregiver. Written instructions given to take home. Verbal understanding given by patient and caregiver. ID band removed before leaving.

## 2023-08-26 NOTE — Other (Signed)
 Pt. Voided in bathroom.

## 2023-09-06 NOTE — H&P (Signed)
 Pre-Admission History and Physical    Patient: Jennifer Johnston   MRN: [id number]   SSN: [id number]   Date of Birth: [birth year]   Age: 63 y.o.   Sex: female     Patient scheduled for: Repair pseudomeningocele, revise lumbar incision.  Date of surgery: 09/07/2023.  Surgeon: Oneil WENDI March, MD    HPI:  Jennifer Johnston is a 63 y.o. female with recent removal pain pump 08/26/2023.   She reports a pain level of 7/10 describing burning sensation at surgical site, continued drainage, as well as persistent headaches since surgery.  No orthostatic symptoms. There is concern abnormality in healing process, potential for CSF leak. Due to persistent symptoms she is being admitted for surgical intervention to include revision of incision and repair of pseudomeningocele.       Past Medical History:   Diagnosis Date    Acute nontraumatic kidney injury 11/2021    Due to NSAIDS and per pt diurectics, once stopped kidney function back to normal    Chronic pain 2025    in pain management with Dr.Che Torry    Exercise tolerance finding 08/24/2023    Pt stated that she can climb stairs and walk a block or two without SOB/chest pain    GERD (gastroesophageal reflux disease) 2003    on meds, well controlled    History of blood transfusion 2011    after back surgery    Hyperlipidemia 2019    takes fish oil    Hypertension 2020    on meds, managed by PCP Dr.Rober Katrinka, also Cindia Gong PA  LOV Jan 2025    Lumbago with sciatica 2025    managed by Dr. Roselinda Melody pain management LOV May 2025    PONV (postoperative nausea and vomiting)     treated with IV medications in the past    Vitamin D deficiency     not on supplements at this time    Wears glasses      Social History     Socioeconomic History    Marital status: Married   Tobacco Use    Smoking status: Never    Smokeless tobacco: Never   Vaping Use    Vaping status: Never Used   Substance and Sexual Activity    Alcohol use: Not Currently    Drug use: Not Currently     Past Surgical History:    Procedure Laterality Date    CESAREAN SECTION  2003    CHOLECYSTECTOMY  2009    COLONOSCOPY  2015    HYSTERECTOMY (CERVIX STATUS UNKNOWN)  2007    INTRATHECAL PUMP IMPLANTATION  2021    Dr.Kerner    LUMBAR FUSION  2011    L5-S1    PAIN MANAGEMENT PROCEDURE N/A 08/26/2023    INTRATHECAL PUMP REMOVAL WITH C-ARM performed by March Oneil NOVAK, MD at [facility] MAIN OR    TONSILLECTOMY  1980     No family history on file.  Allergies   Allergen Reactions    Shrimp Extract Anaphylaxis    Iodinated Contrast Media Rash    Sulfa Antibiotics Hives and Itching     No current facility-administered medications for this encounter.     Current Outpatient Medications   Medication Sig Dispense Refill    betamethasone valerate (VALISONE) 0.1 % cream Apply topically 2 times daily Apply topically 2 times daily.      Iron-Vitamins (GERITOL PO) Take by mouth daily (Patient not taking: Reported on 08/26/2023)      Multiple  Vitamins-Minerals (ALIVE ONCE DAILY WOMENS PO) Take by mouth      Omega-3 Fatty Acids (FISH OIL HIGH POTENCY PO) Take by mouth      famotidine  (PEPCID ) 20 MG tablet Take 1 tablet by mouth daily as needed Indications: GERD      amLODIPine (NORVASC) 5 MG tablet Take 1 tablet by mouth every morning Indications: HTN      gabapentin (NEURONTIN) 600 MG tablet Take 1 tablet by mouth 2 times daily. Indications: nerve pain      methocarbamol (ROBAXIN) 750 MG tablet Take 1 tablet by mouth every 8 hours as needed         ROS:  Denies chills, fever,night sweats,  bowel or bladder dysfunction, unexplained weight loss/weight gain, chest pain, sob or anxiety.    Physical Examination    Gen: Well developed, well nourished 63 y.o. female normal strength, sensation, reflexes. Surgical site appears clean and dry, no signs of induration or maceration.    Assessment and Plan    Due to the pt's persistent symptoms Jennifer Johnston is being admitted to undergo surgical intervention. The post-operative plan of care consists of  a 2 week f/u office visit.   The risks, benefits, complications and alternatives to surgery have been discussed in detail with the patient.  The patient understands and agrees to proceed.

## 2023-09-07 ENCOUNTER — Inpatient Hospital Stay: Payer: Medicaid (Managed Care) | Attending: Orthopaedic Surgery

## 2023-09-07 LAB — COMPREHENSIVE METABOLIC PANEL
ALT: 17 U/L (ref 10–35)
AST: 18 U/L (ref 10–38)
Albumin/Globulin Ratio: 0.8 (ref 0.8–1.7)
Albumin: 3.4 g/dL (ref 3.4–5.0)
Alk Phosphatase: 108 U/L (ref 45–117)
Anion Gap: 13 mmol/L (ref 3.0–18.0)
BUN/Creatinine Ratio: 8 — ABNORMAL LOW (ref 12–20)
BUN: 11 mg/dL (ref 6–23)
CO2: 20 mmol/L — ABNORMAL LOW (ref 21–32)
Calcium: 9.3 mg/dL (ref 8.5–10.1)
Chloride: 108 mmol/L — ABNORMAL HIGH (ref 98–107)
Creatinine: 1.3 mg/dL (ref 0.6–1.3)
Est, Glom Filt Rate: 47 ml/min/1.73m2 — ABNORMAL LOW (ref >60)
Globulin: 4.2 g/dL — ABNORMAL HIGH (ref 2.0–4.0)
Glucose: 94 mg/dL (ref 74–108)
Potassium: 3.9 mmol/L (ref 3.5–5.5)
Sodium: 141 mmol/L (ref 136–145)
Total Bilirubin: 0.2 mg/dL (ref 0.2–1.0)
Total Protein: 7.6 g/dL (ref 6.4–8.2)

## 2023-09-07 LAB — CBC WITH AUTO DIFFERENTIAL
Basophils %: 0.4 % (ref 0.0–2.0)
Basophils Absolute: 0.04 K/UL (ref 0.00–0.10)
Eosinophils %: 0.8 % (ref 0.0–5.0)
Eosinophils Absolute: 0.07 K/UL (ref 0.00–0.40)
Hematocrit: 35.5 % (ref 35.0–45.0)
Hemoglobin: 11.8 g/dL — ABNORMAL LOW (ref 12.0–16.0)
Immature Granulocytes %: 0.3 % (ref 0.0–0.5)
Immature Granulocytes Absolute: 0.03 K/UL (ref 0.00–0.04)
Lymphocytes %: 20 % — ABNORMAL LOW (ref 21.0–52.0)
Lymphocytes Absolute: 1.79 K/UL (ref 0.90–3.60)
MCH: 28.9 pg (ref 24.0–34.0)
MCHC: 33.2 g/dL (ref 31.0–37.0)
MCV: 87 FL (ref 78.0–100.0)
MPV: 9.1 FL — ABNORMAL LOW (ref 9.2–11.8)
Monocytes %: 7.2 % (ref 3.0–10.0)
Monocytes Absolute: 0.64 K/UL (ref 0.05–1.20)
Neutrophils %: 71.3 % (ref 40.0–73.0)
Neutrophils Absolute: 6.37 K/UL (ref 1.80–8.00)
Nucleated RBCs: 0 /100{WBCs}
Platelets: 473 K/uL — ABNORMAL HIGH (ref 135–420)
RBC: 4.08 M/uL — ABNORMAL LOW (ref 4.20–5.30)
RDW: 13.5 % (ref 11.6–14.5)
WBC: 8.9 K/uL (ref 4.6–13.2)
nRBC: 0 K/uL (ref 0.00–0.01)

## 2023-09-07 MED ORDER — LIDOCAINE HCL (PF) 2 % IJ SOLN
2 | INTRAMUSCULAR | Status: AC
Start: 2023-09-07 — End: ?

## 2023-09-07 MED ORDER — PREGABALIN 75 MG PO CAPS
75 | Freq: Once | ORAL | Status: AC
Start: 2023-09-07 — End: 2023-09-07
  Administered 2023-09-07: 17:00:00 75 mg via ORAL

## 2023-09-07 MED ORDER — ONDANSETRON HCL 4 MG/2ML IJ SOLN
4 | INTRAMUSCULAR | Status: AC
Start: 2023-09-07 — End: ?

## 2023-09-07 MED ORDER — LIDOCAINE HCL (PF) 2 % IJ SOLN
2 | Freq: Once | INTRAMUSCULAR | Status: DC | PRN
Start: 2023-09-07 — End: 2023-09-07
  Administered 2023-09-07: 20:00:00 100 via INTRAVENOUS

## 2023-09-07 MED ORDER — DEXAMETHASONE SODIUM PHOSPHATE 4 MG/ML IJ SOLN
4 | INTRAMUSCULAR | Status: AC
Start: 2023-09-07 — End: ?

## 2023-09-07 MED ORDER — ACETAMINOPHEN 325 MG PO TABS
325 | Freq: Once | ORAL | Status: DC | PRN
Start: 2023-09-07 — End: 2023-09-07

## 2023-09-07 MED ORDER — SODIUM CHLORIDE (PF) 0.9 % IJ SOLN
0.9 | INTRAMUSCULAR | Status: DC | PRN
Start: 2023-09-07 — End: 2023-09-07

## 2023-09-07 MED ORDER — STERILE WATER FOR INJECTION (MIXTURES ONLY)
1 | Freq: Once | INTRAMUSCULAR | Status: AC
Start: 2023-09-07 — End: 2023-09-07
  Administered 2023-09-07: 20:00:00 2000 mg via INTRAVENOUS

## 2023-09-07 MED ORDER — SUCCINYLCHOLINE CHLORIDE 100 MG/5ML IV SOSY
100 | Freq: Once | INTRAVENOUS | Status: DC | PRN
Start: 2023-09-07 — End: 2023-09-07
  Administered 2023-09-07: 20:00:00 100 via INTRAVENOUS

## 2023-09-07 MED ORDER — FENTANYL CITRATE (PF) 100 MCG/2ML IJ SOLN
100 | Freq: Once | INTRAMUSCULAR | Status: DC | PRN
Start: 2023-09-07 — End: 2023-09-07
  Administered 2023-09-07: 20:00:00 50 via INTRAVENOUS
  Administered 2023-09-07 (×2): 25 via INTRAVENOUS

## 2023-09-07 MED ORDER — PROPOFOL 200 MG/20ML IV EMUL
200 | Freq: Once | INTRAVENOUS | Status: DC | PRN
Start: 2023-09-07 — End: 2023-09-07
  Administered 2023-09-07: 20:00:00 150 via INTRAVENOUS

## 2023-09-07 MED ORDER — FENTANYL CITRATE (PF) 100 MCG/2ML IJ SOLN
100 | INTRAMUSCULAR | Status: AC
Start: 2023-09-07 — End: ?

## 2023-09-07 MED ORDER — PROMETHAZINE HCL 12.5 MG PO TABS
12.5 | Freq: Once | ORAL | Status: AC
Start: 2023-09-07 — End: 2023-09-07
  Administered 2023-09-07: 17:00:00 12.5 mg via ORAL

## 2023-09-07 MED ORDER — MIDAZOLAM HCL 2 MG/2ML IJ SOLN
2 | INTRAMUSCULAR | Status: AC
Start: 2023-09-07 — End: ?

## 2023-09-07 MED ORDER — PROCHLORPERAZINE EDISYLATE 10 MG/2ML IJ SOLN
10 | Freq: Once | INTRAMUSCULAR | Status: AC | PRN
Start: 2023-09-07 — End: 2023-09-07
  Administered 2023-09-07: 21:00:00 5 mg via INTRAVENOUS

## 2023-09-07 MED ORDER — ROCURONIUM BROMIDE 50 MG/5ML IV SOLN
50 | INTRAVENOUS | Status: AC
Start: 2023-09-07 — End: ?

## 2023-09-07 MED ORDER — MIDAZOLAM HCL 2 MG/2ML IJ SOLN
2 | Freq: Once | INTRAMUSCULAR | Status: DC | PRN
Start: 2023-09-07 — End: 2023-09-07
  Administered 2023-09-07: 20:00:00 2 via INTRAVENOUS

## 2023-09-07 MED ORDER — METOCLOPRAMIDE HCL 5 MG/ML IJ SOLN
5 | Freq: Once | INTRAMUSCULAR | Status: DC | PRN
Start: 2023-09-07 — End: 2023-09-07

## 2023-09-07 MED ORDER — SCOPOLAMINE 1 MG/3DAYS TD PT72
1 | TRANSDERMAL | Status: DC
Start: 2023-09-07 — End: 2023-09-07
  Administered 2023-09-07: 17:00:00 1 via TRANSDERMAL

## 2023-09-07 MED ORDER — PROPOFOL 200 MG/20ML IV EMUL
200 | INTRAVENOUS | Status: AC
Start: 2023-09-07 — End: ?

## 2023-09-07 MED ORDER — LIDOCAINE HCL (PF) 1 % IJ SOLN
1 | Freq: Once | INTRAMUSCULAR | Status: DC | PRN
Start: 2023-09-07 — End: 2023-09-07

## 2023-09-07 MED ORDER — FENTANYL CITRATE (PF) 100 MCG/2ML IJ SOLN
100 | INTRAMUSCULAR | Status: DC | PRN
Start: 2023-09-07 — End: 2023-09-07

## 2023-09-07 MED ORDER — HYDROMORPHONE 0.5MG/0.5ML IJ SOLN
1 | Status: AC | PRN
Start: 2023-09-07 — End: 2023-09-07
  Administered 2023-09-07 (×2): 0.5 mg via INTRAVENOUS

## 2023-09-07 MED ORDER — OXYCODONE HCL 5 MG PO TABS
5 | ORAL_TABLET | Freq: Four times a day (QID) | ORAL | 0 refills | 5.00000 days | Status: AC | PRN
Start: 2023-09-07 — End: 2023-09-14
  Filled 2023-09-07: qty 28, 10d supply, fill #0

## 2023-09-07 MED ORDER — ROCURONIUM BROMIDE 50 MG/5ML IV SOLN
50 | Freq: Once | INTRAVENOUS | Status: DC | PRN
Start: 2023-09-07 — End: 2023-09-07
  Administered 2023-09-07: 20:00:00 10 via INTRAVENOUS

## 2023-09-07 MED ORDER — DEXAMETHASONE SODIUM PHOSPHATE 4 MG/ML IJ SOLN
4 | Freq: Once | INTRAMUSCULAR | Status: DC | PRN
Start: 2023-09-07 — End: 2023-09-07
  Administered 2023-09-07: 20:00:00 10 via INTRAVENOUS

## 2023-09-07 MED ORDER — ACETAMINOPHEN 500 MG PO TABS
500 | Freq: Once | ORAL | Status: AC
Start: 2023-09-07 — End: 2023-09-07
  Administered 2023-09-07: 17:00:00 1000 mg via ORAL

## 2023-09-07 MED ORDER — TAMSULOSIN HCL 0.4 MG PO CAPS
0.4 | Freq: Once | ORAL | Status: AC
Start: 2023-09-07 — End: 2023-09-07
  Administered 2023-09-07: 17:00:00 0.4 mg via ORAL

## 2023-09-07 MED ORDER — OXYCODONE HCL 5 MG PO TABS
5 | Freq: Once | ORAL | Status: DC | PRN
Start: 2023-09-07 — End: 2023-09-07

## 2023-09-07 MED ORDER — NORMAL SALINE FLUSH 0.9 % IV SOLN
0.9 | INTRAVENOUS | Status: DC | PRN
Start: 2023-09-07 — End: 2023-09-07

## 2023-09-07 MED ORDER — SODIUM CHLORIDE 0.9 % IR SOLN
0.9 | Status: DC | PRN
Start: 2023-09-07 — End: 2023-09-07
  Administered 2023-09-07: 20:00:00

## 2023-09-07 MED ORDER — LACTATED RINGERS IV SOLN
INTRAVENOUS | Status: DC
Start: 2023-09-07 — End: 2023-09-07
  Administered 2023-09-07: 17:00:00 via INTRAVENOUS

## 2023-09-07 MED ORDER — LACTATED RINGERS IV SOLN
INTRAVENOUS | Status: DC
Start: 2023-09-07 — End: 2023-09-07

## 2023-09-07 MED ORDER — FAMOTIDINE 20 MG PO TABS
20 | Freq: Once | ORAL | Status: AC
Start: 2023-09-07 — End: 2023-09-07
  Administered 2023-09-07: 17:00:00 20 mg via ORAL

## 2023-09-07 MED ORDER — ONDANSETRON HCL 4 MG/2ML IJ SOLN
4 | Freq: Once | INTRAMUSCULAR | Status: DC | PRN
Start: 2023-09-07 — End: 2023-09-07
  Administered 2023-09-07: 20:00:00 4 via INTRAVENOUS

## 2023-09-07 MED ORDER — SUCCINYLCHOLINE CHLORIDE 100 MG/5ML IV SOSY
100 | INTRAVENOUS | Status: AC
Start: 2023-09-07 — End: ?

## 2023-09-07 MED FILL — DIPRIVAN 200 MG/20ML IV EMUL: 200 MG/20ML | INTRAVENOUS | Qty: 20 | Fill #0

## 2023-09-07 MED FILL — ROCURONIUM BROMIDE 50 MG/5ML IV SOLN: 50 MG/5ML | INTRAVENOUS | Qty: 5 | Fill #0

## 2023-09-07 MED FILL — LACTATED RINGERS IV SOLN: INTRAVENOUS | Qty: 1000 | Fill #0

## 2023-09-07 MED FILL — ACETAMINOPHEN EXTRA STRENGTH 500 MG PO TABS: 500 mg | ORAL | Qty: 2 | Fill #0

## 2023-09-07 MED FILL — MIDAZOLAM HCL 2 MG/2ML IJ SOLN: 2 mg/mL | INTRAMUSCULAR | Qty: 2 | Fill #0

## 2023-09-07 MED FILL — PROMETHAZINE HCL 12.5 MG PO TABS: 12.5 mg | ORAL | Qty: 1 | Fill #0

## 2023-09-07 MED FILL — CEFAZOLIN SODIUM 1 G IJ SOLR: 1 g | INTRAMUSCULAR | Qty: 2000 | Fill #0

## 2023-09-07 MED FILL — PROCHLORPERAZINE EDISYLATE 10 MG/2ML IJ SOLN: 10 MG/2ML | INTRAMUSCULAR | Qty: 2 | Fill #0

## 2023-09-07 MED FILL — SUCCINYLCHOLINE CHLORIDE 100 MG/5ML IV SOSY: 100 MG/5ML | INTRAVENOUS | Qty: 5 | Fill #0

## 2023-09-07 MED FILL — FAMOTIDINE 20 MG PO TABS: 20 mg | ORAL | Qty: 1 | Fill #0

## 2023-09-07 MED FILL — PREGABALIN 75 MG PO CAPS: 75 mg | ORAL | Qty: 1 | Fill #0

## 2023-09-07 MED FILL — DEXAMETHASONE SODIUM PHOSPHATE 4 MG/ML IJ SOLN: 4 mg/mL | INTRAMUSCULAR | Qty: 1 | Fill #0

## 2023-09-07 MED FILL — XYLOCAINE-MPF 2 % IJ SOLN: 2 % | INTRAMUSCULAR | Qty: 5 | Fill #0

## 2023-09-07 MED FILL — DILAUDID 1 MG/ML IJ SOLN: 1 mg/mL | INTRAMUSCULAR | Qty: 0.5 | Fill #0

## 2023-09-07 MED FILL — SCOPOLAMINE 1 MG/3DAYS TD PT72: 1 MG/3DAYS | TRANSDERMAL | Qty: 1 | Fill #0

## 2023-09-07 MED FILL — ONDANSETRON HCL 4 MG/2ML IJ SOLN: 4 MG/2ML | INTRAMUSCULAR | Qty: 2 | Fill #0

## 2023-09-07 MED FILL — FENTANYL CITRATE (PF) 100 MCG/2ML IJ SOLN: 100 MCG/2ML | INTRAMUSCULAR | Qty: 2 | Fill #0

## 2023-09-07 MED FILL — TAMSULOSIN HCL 0.4 MG PO CAPS: 0.4 mg | ORAL | Qty: 1 | Fill #0

## 2023-09-07 NOTE — Anesthesia Post-Procedure Evaluation (Signed)
 Department of Anesthesiology  Postprocedure Note    Patient: Jennifer Johnston  MRN: [id number]  Birthdate: 1960/05/18  Date of evaluation: 09/07/2023    Procedure Summary       Date: 09/07/23 Room / Location: [facility] MAIN 02 / 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])    Surgeons: Alaine Oneil NOVAK, MD Responsible Provider: Landy Prentice PARAS, MD    Anesthesia Type: General ASA Status: 2            Anesthesia Type: General    Aldrete Phase I: Aldrete Score: 9    Aldrete Phase II:      Anesthesia Post Evaluation    Patient location during evaluation: PACU  Patient participation: complete - patient participated  Level of consciousness: sleepy but conscious  Pain score: 0  Airway patency: patent  Nausea & Vomiting: no nausea and no vomiting  Cardiovascular status: blood pressure returned to baseline  Respiratory status: acceptable  Hydration status: euvolemic  Pain management: adequate    No notable events documented.

## 2023-09-07 NOTE — Brief Op Note (Signed)
 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]    Post-Op Diagnosis: Same       Procedure(s):  Repair pseudomeningocele, revise lumbar incision    Surgeon(s):  Alaine Oneil NOVAK, MD    Assistant:  Surgical Assistant: Dasie Russell    Anesthesia: General    Estimated Blood Loss (mL): Minimal    Complications: None    Specimens:   * No specimens in log *    Implants:  * No implants in log *      Drains: * No LDAs found *    Findings:  Present At Time Of Surgery (PATOS) (choose all levels that have infection present):  No infection present  Other Findings: csf draining , but could not visualize leak    Electronically signed by ONEIL NOVAK ALAINE, MD on 2023/10/06 at 4:22 PM

## 2023-09-07 NOTE — Discharge Instructions (Signed)
DISCHARGE SUMMARY from Nurse    PATIENT INSTRUCTIONS:    After general anesthesia or intravenous sedation, for 24 hours or while taking prescription Narcotics:  Limit your activities  Do not drive and operate hazardous machinery  Do not make important personal or business decisions  Do  not drink alcoholic beverages  If you have not urinated within 8 hours after discharge, please contact your surgeon on call.    Report the following to your surgeon:  Excessive pain, swelling, redness or odor of or around the surgical area  Temperature over 100.5  Nausea and vomiting lasting longer than 4 hours or if unable to take medications  Any signs of decreased circulation or nerve impairment to extremity: change in color, persistent  numbness, tingling, coldness or increase pain  Any questions    What to do at Home:  Recommended activity: activity as tolerated and no driving for today.    *  Please give a list of your current medications to your Primary Care Provider.    *  Please update this list whenever your medications are discontinued, doses are      changed, or new medications (including over-the-counter products) are added.    *  Please carry medication information at all times in case of emergency situations.    These are general instructions for a healthy lifestyle:    No smoking/ No tobacco products/ Avoid exposure to second hand smoke  Surgeon General's Warning:  Quitting smoking now greatly reduces serious risk to your health.    Obesity, smoking, and sedentary lifestyle greatly increases your risk for illness    A healthy diet, regular physical exercise & weight monitoring are important for maintaining a healthy lifestyle    You may be retaining fluid if you have a history of heart failure or if you experience any of the following symptoms:  Weight gain of 3 pounds or more overnight or 5 pounds in a week, increased swelling in our hands or feet or shortness of breath while lying flat in bed.  Please call your  doctor as soon as you notice any of these symptoms; do not wait until your next office visit.        The discharge information has been reviewed with the patient and spouse.  The patient and spouse verbalized understanding.  Discharge medications reviewed with the patient and spouse and appropriate educational materials and side effects teaching were provided.  ___________________________________________________________________________________________________________________________________

## 2023-09-07 NOTE — Other (Signed)
 Patient Jennifer Johnston has been informed that [facility] is not responsible for patient belongings per policy and the signed [facility] Patient Agreement document.  Personal items should be sent home or checked in with security.  Patient Jennifer Johnston selected the following action:                            [x]   Send personal items home with a family member or friend                                                 []   Check in personal items with security, excluding clothing                            []   Maintain personal items at the bedside, against recommendation                                 by [facility]                                   ** If patient Jennifer Johnston chooses to maintain personal items at the bedside,                                      Complete the patient belongings inventory in the EMR.

## 2023-09-07 NOTE — Other (Signed)
Received pt. From OR via stretcher. Connected to monitors. Vitals stable.

## 2023-09-07 NOTE — Op Note (Signed)
 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]       Procedure(s):  Repair pseudomeningocele, revise lumbar incision    Surgeon(s):  Alaine Oneil NOVAK, MD    Assistant:   Surgical Assistant: Dasie Russell    Anesthesia: General    Estimated Blood Loss (mL): Minimal    Complications: None    Specimens:   * No specimens in log *    Implants:  * No implants in log *      Drains: * No LDAs found *    Findings:  Present At Time Of Surgery (PATOS) (choose all levels that have infection present):  No infection present  Other Findings: no defined leak    Detailed Description of Procedure:   Following induction of general tracheal anesthesia patient turned prone position spinal frame patient prepped draped usual fashion the wound was inspected.  It was entirely healed except the superior 5 mm where there was clear CSF leaking from an otherwise benign appearing suture line.  There was a single nylon suture superiorly that had been used to stop it up previously.  I opened up the entire incision and dried it there was a small amount of CSF subcutaneously.  Dried it as well as possible had the Valsalva the patient several times and no CSF or other pathologic fluid was noted whatsoever.  I could see the Ethibond sutures I had placed previously.  I then took Ethibond suture and imbricated essentially all the soft tissues in the area.  This was done and several pursestring sutures.  I then used the Ethibond suture to close the dead space which was in very much but to what degree that was dead space I closed it.  We then closed the incision with a running horizontal mattress suture tightly approximating the skin edges providing a thick cutaneous closure.  I then placed Dermabond  over the skin edges in addition to seal it up further.  A sterile occlusive dressing was placed over the wound.  Estimated blood loss 0 complications none specimens none implants none this is a 6 I guess by definition a pseudomeningocele that the CSF leak from the track or fistula that was created by an intrathecal catheter that we removed last week..    Electronically signed by ONEIL NOVAK ALAINE, MD on 09/09/23 at 4:25 PM

## 2023-09-07 NOTE — Interval H&P Note (Signed)
 Update History & Physical    The patient's History and Physical of September 06, 2023 was reviewed with the patient and I examined the patient. There was no change. The surgical site was confirmed by the patient and me.     Plan: The risks, benefits, expected outcome, and alternative to the recommended procedure have been discussed with the patient. Patient understands and wants to proceed with the procedure.     Electronically signed by ONEIL KATHEE MARCH, MD on 09/07/2023 at 11:42 AM

## 2023-09-07 NOTE — Anesthesia Pre-Procedure Evaluation (Signed)
 Department of Anesthesiology  Preprocedure Note       Name:  Jennifer Johnston   Age:  63 y.o.  DOB:  04-01-1960                                          MRN:  [id number]         Date:  09/07/2023      Surgeon: Clotilde):  Alaine Oneil NOVAK, MD    Procedure: Procedure(s):  REPAIR OF PSEUDOMENINGOCELE (LUMBAR)    Medications prior to admission:   Prior to Admission medications    Medication Sig Start Date End Date Taking? Authorizing Provider   oxyCODONE  (ROXICODONE ) 5 MG immediate release tablet Take 1 tablet by mouth every 6 hours as needed for Pain for up to 7 days. Intended supply: 7 days. Take lowest dose possible to manage pain Max Daily Amount: 20 mg 09/07/23 09/14/23 Yes Alaine Oneil NOVAK, MD   Iron-Vitamins (GERITOL PO) Take by mouth daily   Yes [provider]   Multiple Vitamins-Minerals (ALIVE ONCE DAILY WOMENS PO) Take by mouth   Yes [provider]   famotidine  (PEPCID ) 20 MG tablet Take 1 tablet by mouth daily as needed Indications: GERD   Yes [provider]   amLODIPine (NORVASC) 5 MG tablet Take 1 tablet by mouth every morning Indications: HTN   Yes Automatic Reconciliation, Ar   gabapentin (NEURONTIN) 600 MG tablet Take 1 tablet by mouth 2 times daily. Indications: nerve pain   Yes Automatic Reconciliation, Ar   methocarbamol (ROBAXIN) 750 MG tablet Take 1 tablet by mouth every 8 hours as needed   Yes Automatic Reconciliation, Ar   betamethasone valerate (VALISONE) 0.1 % cream Apply topically 2 times daily Apply topically 2 times daily.    [provider]   Omega-3 Fatty Acids (FISH OIL HIGH POTENCY PO) Take by mouth    [provider]       Current medications:    Current Facility-Administered Medications   Medication Dose Route Frequency Provider Last Rate Last Admin   . lidocaine  PF 1 % injection 1 mL  1 mL IntraDERmal Once PRN Leisa Laymon LABOR, APRN - CRNA       . lactated ringers  infusion   IntraVENous Continuous Leisa Laymon LABOR, APRN - CRNA 125 mL/hr at  09/07/23 1314 New Bag at 09/07/23 1314   . sodium chloride  flush 0.9 % injection 5-40 mL  5-40 mL IntraVENous PRN Leisa Laymon LABOR, APRN - CRNA       . scopolamine  (TRANSDERM-SCOP) transdermal patch 1 patch  1 patch TransDERmal Q72H Leisa Laymon LABOR, APRN - CRNA   1 patch at 09/07/23 1314   . ceFAZolin  (ANCEF ) 2,000 mg in sterile water  20 mL IV syringe  2,000 mg IntraVENous Once Blaski, Heidi M, APRN - NP           Allergies:    Allergies   Allergen Reactions   . Shrimp Extract Anaphylaxis   . Iodinated Contrast Media Rash   . Sulfa Antibiotics Hives and Itching       Problem List:  There is no problem list on file for this patient.      Past Medical History:        Diagnosis Date   . Acute nontraumatic kidney injury 11/2021    Due to NSAIDS and per pt diurectics, once stopped kidney function back  to normal   . Chronic pain 2025    in pain management with Dr.Che Torry   . Exercise tolerance finding 08/24/2023    Pt stated that she can climb stairs and walk a block or two without SOB/chest pain   . GERD (gastroesophageal reflux disease) 2003    on meds, well controlled   . History of blood transfusion 2011    after back surgery   . Hyperlipidemia 2019    takes fish oil   . Hypertension 2020    on meds, managed by PCP Dr.Rober Katrinka, also Cindia Gong PA  LOV Jan 2025   . Lumbago with sciatica 2025    managed by Dr. Roselinda Melody pain management LOV May 2025   . PONV (postoperative nausea and vomiting)     treated with IV medications in the past   . Vitamin D deficiency     not on supplements at this time   . Wears glasses        Past Surgical History:        Procedure Laterality Date   . CESAREAN SECTION  2003   . CHOLECYSTECTOMY  2009   . COLONOSCOPY  2015   . HYSTERECTOMY (CERVIX STATUS UNKNOWN)  2007   . INTRATHECAL PUMP IMPLANTATION  2021    Dr.Kerner   . LUMBAR FUSION  2011    L5-S1   . PAIN MANAGEMENT PROCEDURE N/A 08/26/2023    INTRATHECAL PUMP REMOVAL WITH C-ARM performed by Alaine Oneil NOVAK, MD at [facility] MAIN OR    . TONSILLECTOMY  1980       Social History:    Social History     Tobacco Use   . Smoking status: Never   . Smokeless tobacco: Never   Substance Use Topics   . Alcohol use: Not Currently                                Counseling given: Not Answered      Vital Signs (Current):   Vitals:    09/07/23 1245   BP: (!) 160/87   Pulse: (!) 111   Resp: 20   Temp: 98.6 F (37 C)   TempSrc: Oral   SpO2: 100%   Weight: 64 kg (141 lb 3.2 oz)   Height: 1.549 m (5' 1)                                              BP Readings from Last 3 Encounters:   09/07/23 (!) 160/87   08/26/23 127/78       NPO Status: Time of last liquid consumption: 0730                        Time of last solid consumption: 2100                        Date of last liquid consumption: 09/07/23                        Date of last solid food consumption: 09/06/23    BMI:   Wt Readings from Last 3 Encounters:   09/07/23 64 kg (141 lb 3.2 oz)   08/24/23 64.9 kg (143 lb)  08/26/23 66.7 kg (147 lb)     Body mass index is 26.68 kg/m.    CBC:   Lab Results   Component Value Date/Time    WBC 8.9 09/07/2023 01:16 PM    RBC 4.08 09/07/2023 01:16 PM    HGB 11.8 09/07/2023 01:16 PM    HCT 35.5 09/07/2023 01:16 PM    MCV 87.0 09/07/2023 01:16 PM    RDW 13.5 09/07/2023 01:16 PM    PLT 473 09/07/2023 01:16 PM       CMP:   Lab Results   Component Value Date/Time    NA 141 09/07/2023 01:16 PM    K 3.9 09/07/2023 01:16 PM    CL 108 09/07/2023 01:16 PM    CO2 20 09/07/2023 01:16 PM    BUN 11 09/07/2023 01:16 PM    CREATININE 1.30 09/07/2023 01:16 PM    LABGLOM 47 09/07/2023 01:16 PM    LABGLOM >60.0 11/30/2020 01:55 PM    GLUCOSE 94 09/07/2023 01:16 PM    CALCIUM 9.3 09/07/2023 01:16 PM    BILITOT 0.2 09/07/2023 01:16 PM    ALKPHOS 108 09/07/2023 01:16 PM    AST 18 09/07/2023 01:16 PM    ALT 17 09/07/2023 01:16 PM       POC Tests: No results for input(s): POCGLU, POCNA, POCK, POCCL, POCBUN, POCHEMO, POCHCT in the last 72 hours.    Coags: No results found for:  PROTIME, INR, APTT    HCG (If Applicable): No results found for: PREGTESTUR, PREGSERUM, HCG, HCGQUANT     ABGs: No results found for: PHART, PO2ART, PCO2ART, HCO3ART, BEART, O2SATART     Type & Screen (If Applicable):  No results found for: ABORH, LABANTI    Drug/Infectious Status (If Applicable):  No results found for: HIV, HEPCAB    COVID-19 Screening (If Applicable): No results found for: COVID19        Anesthesia Evaluation     history of anesthetic complications: PONV.  Airway: Mallampati: II  TM distance: >3 FB   Neck ROM: full  Mouth opening: > = 3 FB   Dental: normal exam         Pulmonary:Negative Pulmonary ROS and normal exam                               Cardiovascular:  Exercise tolerance: good (>4 METS)  (+) hypertension: no interval change      ECG reviewed                        Neuro/Psych:   Negative Neuro/Psych ROS  (+) neuromuscular disease:            GI/Hepatic/Renal:   (+) GERD: well controlled          Endo/Other: Negative Endo/Other ROS                    Abdominal:             Vascular: negative vascular ROS.         Other Findings:       Anesthesia Plan      general     ASA 2       Induction: intravenous.      Anesthetic plan and risks discussed with patient.      Plan discussed with CRNA.  Kyren Vaux Brummett Jr, MD   09/07/2023
